# Patient Record
Sex: Female | Born: 1980 | Race: Black or African American | Hispanic: No | Marital: Married | Smoking: Never smoker
Health system: Southern US, Community
[De-identification: ages and names within clinical notes are randomized; demographics above are authoritative.]

## PROBLEM LIST (undated history)

## (undated) DIAGNOSIS — B019 Varicella without complication: Secondary | ICD-10-CM

## (undated) DIAGNOSIS — K219 Gastro-esophageal reflux disease without esophagitis: Secondary | ICD-10-CM

## (undated) DIAGNOSIS — Z5189 Encounter for other specified aftercare: Secondary | ICD-10-CM

## (undated) HISTORY — PX: CERVICAL CERCLAGE: SHX1329

## (undated) HISTORY — DX: Encounter for other specified aftercare: Z51.89

## (undated) HISTORY — DX: Varicella without complication: B01.9

---

## 2008-06-21 ENCOUNTER — Emergency Department (HOSPITAL_COMMUNITY): Admission: EM | Admit: 2008-06-21 | Discharge: 2008-06-21 | Payer: Self-pay | Admitting: Emergency Medicine

## 2008-06-23 ENCOUNTER — Emergency Department: Payer: Self-pay

## 2008-06-28 ENCOUNTER — Emergency Department: Payer: Self-pay

## 2008-09-25 ENCOUNTER — Emergency Department: Payer: Self-pay | Admitting: Internal Medicine

## 2009-10-21 ENCOUNTER — Ambulatory Visit: Payer: Self-pay | Admitting: Family Medicine

## 2009-10-21 ENCOUNTER — Ambulatory Visit: Payer: Self-pay | Admitting: Obstetrics and Gynecology

## 2009-11-30 ENCOUNTER — Emergency Department: Payer: Self-pay | Admitting: Emergency Medicine

## 2009-11-30 ENCOUNTER — Observation Stay: Payer: Self-pay | Admitting: Obstetrics and Gynecology

## 2010-01-14 ENCOUNTER — Ambulatory Visit: Payer: Self-pay | Admitting: Obstetrics and Gynecology

## 2010-01-15 ENCOUNTER — Ambulatory Visit: Payer: Self-pay | Admitting: Obstetrics and Gynecology

## 2010-01-20 ENCOUNTER — Inpatient Hospital Stay: Payer: Self-pay | Admitting: Obstetrics and Gynecology

## 2010-11-18 ENCOUNTER — Emergency Department: Payer: Self-pay | Admitting: Emergency Medicine

## 2011-04-26 ENCOUNTER — Emergency Department: Payer: Self-pay | Admitting: Emergency Medicine

## 2011-04-26 LAB — URINALYSIS, COMPLETE
Bilirubin,UR: NEGATIVE
Blood: NEGATIVE
Nitrite: NEGATIVE
Specific Gravity: 1.026 (ref 1.003–1.030)
Squamous Epithelial: 2
WBC UR: NONE SEEN /HPF (ref 0–5)

## 2015-04-20 ENCOUNTER — Ambulatory Visit (INDEPENDENT_AMBULATORY_CARE_PROVIDER_SITE_OTHER): Payer: 59 | Admitting: Family Medicine

## 2015-04-20 ENCOUNTER — Encounter: Payer: Self-pay | Admitting: Family Medicine

## 2015-04-20 VITALS — BP 126/82 | HR 63 | Temp 97.2°F | Ht 67.0 in | Wt 254.5 lb

## 2015-04-20 DIAGNOSIS — M545 Low back pain, unspecified: Secondary | ICD-10-CM

## 2015-04-20 DIAGNOSIS — M549 Dorsalgia, unspecified: Secondary | ICD-10-CM | POA: Insufficient documentation

## 2015-04-20 DIAGNOSIS — E669 Obesity, unspecified: Secondary | ICD-10-CM | POA: Diagnosis not present

## 2015-04-20 DIAGNOSIS — Z0000 Encounter for general adult medical examination without abnormal findings: Secondary | ICD-10-CM | POA: Insufficient documentation

## 2015-04-20 DIAGNOSIS — M898X1 Other specified disorders of bone, shoulder: Secondary | ICD-10-CM | POA: Insufficient documentation

## 2015-04-20 LAB — LIPID PANEL
CHOLESTEROL: 165 mg/dL (ref 0–200)
HDL: 49 mg/dL (ref >39.00)
LDL Cholesterol: 108 mg/dL — ABNORMAL HIGH (ref 0–99)
NonHDL: 115.7
Total CHOL/HDL Ratio: 3
Triglycerides: 38 mg/dL (ref 0.0–149.0)
VLDL: 7.6 mg/dL (ref 0.0–40.0)

## 2015-04-20 LAB — HEMOGLOBIN A1C: Hgb A1c MFr Bld: 5.5 % (ref 4.6–6.5)

## 2015-04-20 LAB — COMPREHENSIVE METABOLIC PANEL
ALBUMIN: 4.2 g/dL (ref 3.5–5.2)
ALK PHOS: 50 U/L (ref 39–117)
ALT: 13 U/L (ref 0–35)
AST: 13 U/L (ref 0–37)
BUN: 9 mg/dL (ref 6–23)
CO2: 27 mEq/L (ref 19–32)
Calcium: 9.2 mg/dL (ref 8.4–10.5)
Chloride: 104 mEq/L (ref 96–112)
Creatinine, Ser: 0.61 mg/dL (ref 0.40–1.20)
GFR: 144.89 mL/min (ref >60.00)
GLUCOSE: 80 mg/dL (ref 70–99)
POTASSIUM: 4 meq/L (ref 3.5–5.1)
Sodium: 139 mEq/L (ref 135–145)
TOTAL PROTEIN: 7.1 g/dL (ref 6.0–8.3)
Total Bilirubin: 0.6 mg/dL (ref 0.2–1.2)

## 2015-04-20 LAB — CBC
HEMATOCRIT: 41.1 % (ref 36.0–46.0)
HEMOGLOBIN: 13.7 g/dL (ref 12.0–15.0)
MCHC: 33.3 g/dL (ref 30.0–36.0)
MCV: 77.3 fl — ABNORMAL LOW (ref 78.0–100.0)
Platelets: 163 10*3/uL (ref 150.0–400.0)
RBC: 5.31 Mil/uL — ABNORMAL HIGH (ref 3.87–5.11)
RDW: 12.9 % (ref 11.5–15.5)
WBC: 4.1 10*3/uL (ref 4.0–10.5)

## 2015-04-20 LAB — TSH: TSH: 1.22 u[IU]/mL (ref 0.35–4.50)

## 2015-04-20 MED ORDER — MELOXICAM 15 MG PO TABS: 15.0000 mg | ORAL_TABLET | Freq: Every day | ORAL | Status: DC | PRN

## 2015-04-20 MED ORDER — CYCLOBENZAPRINE HCL 10 MG PO TABS: 10.0000 mg | ORAL_TABLET | Freq: Three times a day (TID) | ORAL | Status: DC | PRN

## 2015-04-20 NOTE — Assessment & Plan Note (Signed)
Pap smear up-to-date. Influenza flu vaccines up-to-date. Encouraged exercise and healthy eating today as patient has gained 100 pounds in the past 2 years. Screening labs today.

## 2015-04-20 NOTE — Progress Notes (Signed)
Subjective:  Patient ID: Rita [name], female    DOB: Aug 18, 1980  Age: 36 y.o. MRN: [id number]  CC: Establish care; Back pain.  HPI Rita [name] is a 35 y.o. female presents to the clinic today to establish care. She also complains of back pain.  Preventative Healthcare  Pap smear: Up-to-date. Was done 2 weeks ago at her OB/GYN's office.  Mammogram: Not indicated.  Immunizations  Tetanus - 2011.  Flu - up-to-date. 11/30/14.  Labs: Obtaining labs today.   Exercise: No regular exercise  Alcohol use: See below.   Smoking/tobacco use: No.  Wears seat belt: Yes.   Back pain  Patient reports a 2 month history of back pain.  She states the pain is located in her low back as well as just below the right scapula.  Pain is intermittently severe.  She's been taking BC powder and an OTC "Back Max" with some improvement.  No associated upper extremity numbness or tingling.  No exacerbating factors.  Of note, patient she really her pain to large breasts and was previously scheduled to see a Psychiatric nurse.  PMH, Surgical Hx, Family Hx, Social History reviewed and updated as below.  Past Medical History  Diagnosis Date  . Chicken pox    Past Surgical History  Procedure Laterality Date  . No past surgeries      Family History  Problem Relation Age of Onset  . Lung cancer Father   . Uterine cancer Paternal Aunt   . Stroke Maternal Grandmother   . Breast cancer Paternal Grandmother   . Breast cancer Paternal Aunt    Social History  Substance Use Topics  . Smoking status: Never Smoker   . Smokeless tobacco: Never Used  . Alcohol Use: 0.0 oz/week    0 Standard drinks or equivalent per week     Comment: Occasional (1 drink 1-2 times/month).   Review of Systems  Genitourinary:       Urinary incontinence.  Musculoskeletal: Positive for back pain.  All other systems reviewed and are negative.  Objective:   Today's Vitals: BP 126/82 mmHg  Pulse 63   Temp(Src) 97.2 F (36.2 C) (Oral)  Ht 5' 7"  (1.702 m)  Wt 254 lb 8 oz (115.44 kg)  BMI 39.85 kg/m2  SpO2 98%  LMP 03/23/2015  Physical Exam  Constitutional: She is oriented to person, place, and time. She appears well-developed and well-nourished. No distress.  Obese.   HENT:  Head: Normocephalic and atraumatic.  Mouth/Throat: Oropharynx is clear and moist. No oropharyngeal exudate.  Normal TM's bilaterally.   Eyes: Conjunctivae are normal. No scleral icterus.  Neck: Neck supple. No thyromegaly present.  Cardiovascular: Normal rate and regular rhythm.   No murmur heard. Pulmonary/Chest: Effort normal and breath sounds normal. She has no wheezes. She has no rales.  Abdominal: Soft. She exhibits no distension. There is no tenderness. There is no rebound and no guarding.  Musculoskeletal:  Back  R scapula - discrete area of tenderness to palpation just below the scapula.  Low back - nontender to palpation. Normal ROM.  Lymphadenopathy:    She has no cervical adenopathy.  Neurological: She is alert and oriented to person, place, and time.  Skin: Skin is warm and dry. No rash noted.  Psychiatric: She has a normal mood and affect.  Vitals reviewed.  Assessment & Plan:   Problem List Items Addressed This Visit    Preventative health care - Primary    Pap smear up-to-date. Influenza flu  vaccines up-to-date. Encouraged exercise and healthy eating today as patient has gained 100 pounds in the past 2 years. Screening labs today.      Pain of right scapula    Pain just below the scapula. Appears MSK in origin. Treating with Mobic and flexeril. Also sending to physical therapy.      Relevant Orders   Ambulatory referral to Physical Therapy   Obesity (BMI 35.0-39.9 without comorbidity) (HCC)   Relevant Medications   phentermine (ADIPEX-P) 37.5 MG tablet   Other Relevant Orders   Amb ref to Medical Nutrition Therapy-MNT   CBC (Completed)   Comp Met (CMET) (Completed)    Lipid Profile (Completed)   HgB A1c (Completed)   TSH (Completed)   Back pain    MSK in origin. Sending to PT. Treating with Mobic and Flexeril.       Relevant Medications   meloxicam (MOBIC) 15 MG tablet   cyclobenzaprine (FLEXERIL) 10 MG tablet      Outpatient Encounter Prescriptions as of 04/20/2015  Medication Sig  . levonorgestrel (MIRENA) 20 MCG/24HR IUD by Intrauterine route.  . phentermine (ADIPEX-P) 37.5 MG tablet Take 37.5 mg by mouth daily.  . cyclobenzaprine (FLEXERIL) 10 MG tablet Take 1 tablet (10 mg total) by mouth 3 (three) times daily as needed for muscle spasms.  . meloxicam (MOBIC) 15 MG tablet Take 1 tablet (15 mg total) by mouth daily as needed for pain.   No facility-administered encounter medications on file as of 04/20/2015.    Follow-up: 1-3 months.  [place]

## 2015-04-20 NOTE — Patient Instructions (Addendum)
We will be in contact regarding a referral to physical therapy and nutrition.  Use the meloxicam and Flexeril as needed for your pain/spasm.  Follow up in 1-3 months.  Take care  Dr. Lacinda Axon   Health Maintenance, Female Adopting a healthy lifestyle and getting preventive care can go a long way to promote health and wellness. Talk with your health care provider about what schedule of regular examinations is right for you. This is a good chance for you to check in with your provider about disease prevention and staying healthy. In between checkups, there are plenty of things you can do on your own. Experts have done a lot of research about which lifestyle changes and preventive measures are most likely to keep you healthy. Ask your health care provider for more information. WEIGHT AND DIET  Eat a healthy diet  Be sure to include plenty of vegetables, fruits, low-fat dairy products, and lean protein.  Do not eat a lot of foods high in solid fats, added sugars, or salt.  Get regular exercise. This is one of the most important things you can do for your health.  Most adults should exercise for at least 150 minutes each week. The exercise should increase your heart rate and make you sweat (moderate-intensity exercise).  Most adults should also do strengthening exercises at least twice a week. This is in addition to the moderate-intensity exercise.  Maintain a healthy weight  Body mass index (BMI) is a measurement that can be used to identify possible weight problems. It estimates body fat based on height and weight. Your health care provider can help determine your BMI and help you achieve or maintain a healthy weight.  For females 56 years of age and older:   A BMI below 18.5 is considered underweight.  A BMI of 18.5 to 24.9 is normal.  A BMI of 25 to 29.9 is considered overweight.  A BMI of 30 and above is considered obese.  Watch levels of cholesterol and blood lipids  You should  start having your blood tested for lipids and cholesterol at 34 years of age, then have this test every 5 years.  You may need to have your cholesterol levels checked more often if:  Your lipid or cholesterol levels are high.  You are older than 34 years of age.  You are at high risk for heart disease.  CANCER SCREENING   Lung Cancer  Lung cancer screening is recommended for adults 56-24 years old who are at high risk for lung cancer because of a history of smoking.  A yearly low-dose CT scan of the lungs is recommended for people who:  Currently smoke.  Have quit within the past 15 years.  Have at least a 30-pack-year history of smoking. A pack year is smoking an average of one pack of cigarettes a day for 1 year.  Yearly screening should continue until it has been 15 years since you quit.  Yearly screening should stop if you develop a health problem that would prevent you from having lung cancer treatment.  Breast Cancer  Practice breast self-awareness. This means understanding how your breasts normally appear and feel.  It also means doing regular breast self-exams. Let your health care provider know about any changes, no matter how small.  If you are in your 20s or 30s, you should have a clinical breast exam (CBE) by a health care provider every 1-3 years as part of a regular health exam.  If you are 40  or older, have a CBE every year. Also consider having a breast X-ray (mammogram) every year.  If you have a family history of breast cancer, talk to your health care provider about genetic screening.  If you are at high risk for breast cancer, talk to your health care provider about having an MRI and a mammogram every year.  Breast cancer gene (BRCA) assessment is recommended for women who have family members with BRCA-related cancers. BRCA-related cancers include:  Breast.  Ovarian.  Tubal.  Peritoneal cancers.  Results of the assessment will determine the  need for genetic counseling and BRCA1 and BRCA2 testing. Cervical Cancer Your health care provider may recommend that you be screened regularly for cancer of the pelvic organs (ovaries, uterus, and vagina). This screening involves a pelvic examination, including checking for microscopic changes to the surface of your cervix (Pap test). You may be encouraged to have this screening done every 3 years, beginning at age 46.  For women ages 59-65, health care providers may recommend pelvic exams and Pap testing every 3 years, or they may recommend the Pap and pelvic exam, combined with testing for human papilloma virus (HPV), every 5 years. Some types of HPV increase your risk of cervical cancer. Testing for HPV may also be done on women of any age with unclear Pap test results.  Other health care providers may not recommend any screening for nonpregnant women who are considered low risk for pelvic cancer and who do not have symptoms. Ask your health care provider if a screening pelvic exam is right for you.  If you have had past treatment for cervical cancer or a condition that could lead to cancer, you need Pap tests and screening for cancer for at least 20 years after your treatment. If Pap tests have been discontinued, your risk factors (such as having a new sexual partner) need to be reassessed to determine if screening should resume. Some women have medical problems that increase the chance of getting cervical cancer. In these cases, your health care provider may recommend more frequent screening and Pap tests. Colorectal Cancer  This type of cancer can be detected and often prevented.  Routine colorectal cancer screening usually begins at 34 years of age and continues through 34 years of age.  Your health care provider may recommend screening at an earlier age if you have risk factors for colon cancer.  Your health care provider may also recommend using home test kits to check for hidden blood in  the stool.  A small camera at the end of a tube can be used to examine your colon directly (sigmoidoscopy or colonoscopy). This is done to check for the earliest forms of colorectal cancer.  Routine screening usually begins at age 26.  Direct examination of the colon should be repeated every 5-10 years through 34 years of age. However, you may need to be screened more often if early forms of precancerous polyps or small growths are found. Skin Cancer  Check your skin from head to toe regularly.  Tell your health care provider about any new moles or changes in moles, especially if there is a change in a mole's shape or color.  Also tell your health care provider if you have a mole that is larger than the size of a pencil eraser.  Always use sunscreen. Apply sunscreen liberally and repeatedly throughout the day.  Protect yourself by wearing long sleeves, pants, a wide-brimmed hat, and sunglasses whenever you are outside. HEART DISEASE,  DIABETES, AND HIGH BLOOD PRESSURE   High blood pressure causes heart disease and increases the risk of stroke. High blood pressure is more likely to develop in:  People who have blood pressure in the high end of the normal range (130-139/85-89 mm Hg).  People who are overweight or obese.  People who are African American.  If you are 15-51 years of age, have your blood pressure checked every 3-5 years. If you are 62 years of age or older, have your blood pressure checked every year. You should have your blood pressure measured twice--once when you are at a hospital or clinic, and once when you are not at a hospital or clinic. Record the average of the two measurements. To check your blood pressure when you are not at a hospital or clinic, you can use:  An automated blood pressure machine at a pharmacy.  A home blood pressure monitor.  If you are between 16 years and 93 years old, ask your health care provider if you should take aspirin to prevent  strokes.  Have regular diabetes screenings. This involves taking a blood sample to check your fasting blood sugar level.  If you are at a normal weight and have a low risk for diabetes, have this test once every three years after 34 years of age.  If you are overweight and have a high risk for diabetes, consider being tested at a younger age or more often. PREVENTING INFECTION  Hepatitis B  If you have a higher risk for hepatitis B, you should be screened for this virus. You are considered at high risk for hepatitis B if:  You were born in a country where hepatitis B is common. Ask your health care provider which countries are considered high risk.  Your parents were born in a high-risk country, and you have not been immunized against hepatitis B (hepatitis B vaccine).  You have HIV or AIDS.  You use needles to inject street drugs.  You live with someone who has hepatitis B.  You have had sex with someone who has hepatitis B.  You get hemodialysis treatment.  You take certain medicines for conditions, including cancer, organ transplantation, and autoimmune conditions. Hepatitis C  Blood testing is recommended for:  Everyone born from [birth year] through 1964.  Anyone with known risk factors for hepatitis C. Sexually transmitted infections (STIs)  You should be screened for sexually transmitted infections (STIs) including gonorrhea and chlamydia if:  You are sexually active and are younger than 34 years of age.  You are older than 34 years of age and your health care provider tells you that you are at risk for this type of infection.  Your sexual activity has changed since you were last screened and you are at an increased risk for chlamydia or gonorrhea. Ask your health care provider if you are at risk.  If you do not have HIV, but are at risk, it may be recommended that you take a prescription medicine daily to prevent HIV infection. This is called pre-exposure prophylaxis  (PrEP). You are considered at risk if:  You are sexually active and do not regularly use condoms or know the HIV status of your partner(s).  You take drugs by injection.  You are sexually active with a partner who has HIV. Talk with your health care provider about whether you are at high risk of being infected with HIV. If you choose to begin PrEP, you should first be tested for HIV. You should then  be tested every 3 months for as long as you are taking PrEP.  PREGNANCY   If you are premenopausal and you may become pregnant, ask your health care provider about preconception counseling.  If you may become pregnant, take 400 to 800 micrograms (mcg) of folic acid every day.  If you want to prevent pregnancy, talk to your health care provider about birth control (contraception). OSTEOPOROSIS AND MENOPAUSE   Osteoporosis is a disease in which the bones lose minerals and strength with aging. This can result in serious bone fractures. Your risk for osteoporosis can be identified using a bone density scan.  If you are 65 years of age or older, or if you are at risk for osteoporosis and fractures, ask your health care provider if you should be screened.  Ask your health care provider whether you should take a calcium or vitamin D supplement to lower your risk for osteoporosis.  Menopause may have certain physical symptoms and risks.  Hormone replacement therapy may reduce some of these symptoms and risks. Talk to your health care provider about whether hormone replacement therapy is right for you.  HOME CARE INSTRUCTIONS   Schedule regular health, dental, and eye exams.  Stay current with your immunizations.   Do not use any tobacco products including cigarettes, chewing tobacco, or electronic cigarettes.  If you are pregnant, do not drink alcohol.  If you are breastfeeding, limit how much and how often you drink alcohol.  Limit alcohol intake to no more than 1 drink per day for  nonpregnant women. One drink equals 12 ounces of beer, 5 ounces of wine, or 1 ounces of hard liquor.  Do not use street drugs.  Do not share needles.  Ask your health care provider for help if you need support or information about quitting drugs.  Tell your health care provider if you often feel depressed.  Tell your health care provider if you have ever been abused or do not feel safe at home.   This information is not intended to replace advice given to you by your health care provider. Make sure you discuss any questions you have with your health care provider.   Document Released: 08/02/2010 Document Revised: 02/07/2014 Document Reviewed: 12/19/2012 Elsevier Interactive Patient Education [employer].

## 2015-04-20 NOTE — Progress Notes (Signed)
Pre visit review using our clinic review tool, if applicable. No additional management support is needed unless otherwise documented below in the visit note. 

## 2015-04-20 NOTE — Assessment & Plan Note (Signed)
Pain just below the scapula. Appears MSK in origin. Treating with Mobic and flexeril. Also sending to physical therapy.

## 2015-04-20 NOTE — Assessment & Plan Note (Signed)
MSK in origin. Sending to PT. Treating with Mobic and Flexeril.

## 2015-04-29 ENCOUNTER — Ambulatory Visit: Payer: 59 | Attending: Family Medicine

## 2015-04-29 DIAGNOSIS — M898X1 Other specified disorders of bone, shoulder: Secondary | ICD-10-CM | POA: Diagnosis not present

## 2015-04-29 DIAGNOSIS — M545 Low back pain: Secondary | ICD-10-CM | POA: Diagnosis present

## 2015-04-29 NOTE — Patient Instructions (Signed)
Pt was recommended to place a towel roll behind her low back when sitting at work. Pt verbalized understanding.

## 2015-04-29 NOTE — Therapy (Signed)
[place] [facility] [address], [place], [address] Phone: [phone]   Fax:  [phone]  Physical Therapy Evaluation  Patient Details  Name: Rita [name] MRN: [id number] Date of Birth: Jun 09, 1980 Referring Provider: Tommie Sams, DO  Encounter Date: 04/29/2015      PT End of Session - 04/29/15 0805    Visit Number 1   Number of Visits 9   Date for PT Re-Evaluation 05/28/15   PT Start Time 0805  pt filling out paperwork   PT Stop Time 0908   PT Time Calculation (min) 63 min   Activity Tolerance Patient tolerated treatment well   Behavior During Therapy [facility] for tasks assessed/performed      Past Medical History  Diagnosis Date  . Chicken pox     Past Surgical History  Procedure Laterality Date  . Cervical cerclage      for childbirth    There were no vitals filed for this visit.  Visit Diagnosis:  Pain of right scapula - Plan: PT plan of care cert/re-cert  Bilateral low back pain, with sciatica presence unspecified - Plan: PT plan of care cert/re-cert      Subjective Assessment - 04/29/15 0812    Subjective R shoulder blade: 8/10 at worst (3 weeks ago). 3/10 R shoulder blade currently (pt sitting) and at best.   Low back: 5/10 at worst (deep aching pain); 0/10 current (pt sitting on chair) and best.    Pertinent History R scapular and back pain. Pain worsened 3 weeks ago. Pt states that her pain increased over time. Had low back pain for a while. Low back pain increased which went up to her R side. Pain is predominantly inferior to her shoulder blade. Difficulty sleeping due to pain. Area is also sensitive to touch. 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 Pain worsens towards the end of the day. Uses a headset when talking on the phone at work. Pt is R hand dominant.   Patient Stated Goals Improve overall mobility.    Currently in  Pain? Yes   Pain Score --  please see subjective   Pain Location --  R scapula, bilateral low back   Pain Descriptors / Indicators Aching;Burning;Stabbing   Pain Type Acute pain   Pain Onset 1 to 4 weeks ago   Pain Frequency Constant   Aggravating Factors  sitting (increases stiffness from shoulder blade to R flank).    Pain Relieving Factors Standing up, backaid max OTC, muscle rub (like Bengay), sleeping on a firm mattress   Multiple Pain Sites Yes  R scapula and low back      Objectives:   There-ex  Directed patient with R shoulder adduction resisting red band 5x5 seconds   L shoulder adduction resisting red band 5x (increased R scapular symptoms) L lateral shift correction (decreased R scapular symptoms but increased L low back discomfort; low back discomfort decreased with rest)  Sitting with lumbar towel roll (decreased low back discomfort). Reviewed and given as part of her HEP. Pt verbalized understanding.   Improved exercise technique, movement at target joints, use of target muscles after mod verbal, visual, tactile cues.              [facility] PT Assessment - 04/29/15 0802    Assessment   Medical Diagnosis Pain in R scapula   Referring Provider Tommie Sams, DO  Onset Date/Surgical Date 04/01/15  3 weeks ago   Hand Dominance Right   Prior Therapy No known physical therapy for current condition   Precautions   Precaution Comments no known precautions   Restrictions   Other Position/Activity Restrictions no known restrictions   Balance Screen   Has the patient fallen in the past 6 months No   Has the patient had a decrease in activity level because of a fear of falling?  No   Is the patient reluctant to leave their home because of a fear of falling?  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    Observation/Other Assessments   Observations Repeated lumbar flexion test increased R scapular symptoms. Long sit test suggests possible anterior nutation of R innominate   Modified Oswertry 30%   Posture/Postural Control   Posture Comments L lateral shift, slight R mid back side bend, bilateral genu valgus, bilateral foot pronation   AROM   Right Shoulder Internal Rotation --  Functional IR: thumb to L3 with symptoms   Right Shoulder External Rotation --  Functional ER: index finger to T2 with symptoms   Left Shoulder Internal Rotation --  Functional IR: full   Left Shoulder External Rotation --  functional ER: full   Cervical Flexion Full   Cervical Extension WFL with R UT symptoms  movement preference to C5/C6   Cervical - Right Side Bend WFL   Cervical - Left Side Bend WFL with R UT symptoms   Cervical - Right Rotation WFL with R UT symptoms   Cervical - Left Rotation WFL   Lumbar Flexion WFL   Lumbar Extension limited with R trunk pain. Reproduced R shoulder blade symptoms the most  movement preferenc to around L3/L4   Lumbar - Right Side Bend limited with inferior R scapular pain   Lumbar - Left Side Bend limited with L low back discomfort. No R scapular symptoms.   Lumbar - Right Rotation limited with reproduction in inferior shoulder blade symptoms   Lumbar - Left Rotation WFL, no pain.    Strength   Right Hip Extension 4-/5   Left Hip Extension 4-/5   Palpation   Palpation comment Slight increase L rhomboid minor muscle tension. TTP R L5,4,3,2,1; TTP R T11,10,9 (most TTP around T9 with reproduction of symptoms), 8,7,6,5,4,3                          PT Education - 04/29/15 1323    Education provided Yes   Education Details ther-ex, HEP, plan of care   Person(s) Educated Patient   Methods Explanation;Demonstration;Tactile cues;Verbal cues   Comprehension Verbalized understanding;Returned  demonstration             PT Long Term Goals - 04/29/15 1307    PT LONG TERM GOAL #1   Title Patient will have a decrease in R scapular pain to 5/10 or less at worst to promote ability to tolerate sitting and perform work related tasks as well as to take care of her child.    Baseline 8/10 at worst   Time 4   Period Weeks   Status New   PT LONG TERM GOAL #2   Title Patient will have a decrease  in low back pain to 3/10 or less at worst to promote ability to tolerate sitting and perform work related tasks as well as to take care of her child.    Baseline 5/10 at worst   Time 4   Period Weeks   Status New   PT LONG TERM GOAL #3   Title Patient will improve her functional ER and IR AROM for R UE and without R scapular pain to promote ability to reach behind her head and back.    Time 4   Period Weeks   Status New   PT LONG TERM GOAL #4   Title Patient will improve her Modified Oswestry Low Back Pain Disability Questionnaire as a demonstration of improved function.    Baseline 30%   Time 4   Period Weeks   Status New               Plan - 04/29/15 1301    Clinical Impression Statement Patient is a 35 year old female who came to physical therapy secondary to R scapular and bilateral low back pain. She also presents with TTP to R low back and R thoracic spine, altered posture, limited R shoulder functional IR and ER, bilateral glute max weakness, and difficulty tolerating sitting (to perform work duties), as well as taking care of her child due to her symptoms. Patient will benefit from skilled physical therapy intervention to address the aforementioned deficits.    Pt will benefit from skilled therapeutic intervention in order to improve on the following deficits Pain;Decreased strength;Postural dysfunction;Decreased range of motion   Rehab Potential Good   Clinical Impairments Affecting Rehab Potential No known clinical impairments affecting rehab potential.    PT Frequency  2x / week   PT Duration 4 weeks   PT Treatment/Interventions Therapeutic activities;Therapeutic exercise;Manual techniques;Electrical Stimulation;Traction;Ultrasound;Neuromuscular re-education;Iontophoresis 4mg /ml Dexamethasone;Dry needling   PT Next Visit Plan posture, manual therapy, scapular, and hip strengthening, movement   Consulted and Agree with Plan of Care Patient         Problem List Patient Active Problem List   Diagnosis Date Noted  . Back pain 04/20/2015  . Pain of right scapula 04/20/2015  . Obesity (BMI 35.0-39.9 without comorbidity) (HCC) 04/20/2015  . Preventative health care 04/20/2015     Thank you for your referral.   Loralyn FreshwaterMiguel Harry Shuck PT, DPT   04/29/2015, 1:39 PM  [place] [facility] [address] [place], [place], [address] Phone: [phone]   Fax:  [phone]  Name: Rita [name] MRN: [id number] Date of Birth: 1980/05/09

## 2015-05-05 ENCOUNTER — Ambulatory Visit: Payer: 59 | Attending: Family Medicine

## 2015-05-05 DIAGNOSIS — M545 Low back pain: Secondary | ICD-10-CM | POA: Insufficient documentation

## 2015-05-05 DIAGNOSIS — M898X1 Other specified disorders of bone, shoulder: Secondary | ICD-10-CM | POA: Insufficient documentation

## 2015-05-05 DIAGNOSIS — M546 Pain in thoracic spine: Secondary | ICD-10-CM | POA: Diagnosis present

## 2015-05-05 NOTE — Therapy (Signed)
[facility] [address] [place], [place], [address] Phone: [phone]   Fax:  [phone]  Physical Therapy Treatment  Patient Details  Name: Rita [name] MRN: [id number] Date of Birth: October 14, 1980 Referring Provider: Tommie SamsJayce G Cook, DO  Encounter Date: 05/05/2015      PT End of Session - 05/05/15 0856    Visit Number 2   Number of Visits 9   Date for PT Re-Evaluation 05/28/15   PT Start Time 0856   PT Stop Time 0926   PT Time Calculation (min) 30 min   Activity Tolerance Patient tolerated treatment well   Behavior During Therapy [facility] for tasks assessed/performed      Past Medical History  Diagnosis Date  . Chicken pox     Past Surgical History  Procedure Laterality Date  . Cervical cerclage      for childbirth    There were no vitals filed for this visit.  Visit Diagnosis:  Pain of right scapula  Bilateral low back pain, with sciatica presence unspecified      Subjective Assessment - 05/05/15 0857    Subjective R shoulder blade feels a lot better. 2/10 currently. Low back is about 4-5/10.    Pertinent History R scapular and back pain. Pain worsened 3 weeks ago. Pt states that her pain increased over time. Had low back pain for a while. Low back pain increased which went up to her R side. Pain is predominantly inferior to her shoulder blade. Difficulty sleeping due to pain. Area is also sensitive to touch. 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 Standing up helps relieve pain. Pain worsens towards the end of the day. Uses a headset when talking on the phone at work. Pt is R hand dominant.   Patient Stated Goals Improve overall mobility.    Currently in Pain? Yes   Pain Score --  please see subjective   Pain Onset 1 to 4 weeks ago   Multiple Pain Sites Yes        Objectives  There-ex  Directed patient with sitting on chair with lumbar towel roll x 3 min    Standing L lateral shift correction 10x3 with 5 second holds (decreased back pain to 2-3/10. No R scapular pain afterwards)  Prone on elbows with bilateral hip IR with deep breaths x 50 seconds (back discomfort)  Prone glute max set 10x2 with 5 second holds each LE  L S/L R hip abduction 5x (increased R scapular tension) R S/L L hip abduction 5x3   Reviewed HEP. Pt demonstrated and verbalized understanding.     Improved exercise technique, movement at target joints, use of target muscles after mod verbal, visual, tactile cues.        Decreased low back and R scapular pain with L lateral shift correction. Continue with lateral shift correction and gentle lumbar extension related exercises. Decreased low back pain to 2/10 and R shoulder blade discomfort to 1-2/10 after session. Patient will benefit from continued skilled physical therapy services to decrease R scapular and low back pain and improve function.                    PT Education - 05/05/15 0900    Education provided Yes   Education Details ther-ex, HEP   Person(s) Educated Patient   Methods Explanation;Demonstration;Tactile cues;Verbal cues;Handout   Comprehension Verbalized understanding;Returned demonstration  PT Long Term Goals - 04/29/15 1307    PT LONG TERM GOAL #1   Title Patient will have a decrease in R scapular pain to 5/10 or less at worst to promote ability to tolerate sitting and perform work related tasks as well as to take care of her child.    Baseline 8/10 at worst   Time 4   Period Weeks   Status New   PT LONG TERM GOAL #2   Title Patient will have a decrease in low back pain to 3/10 or less at worst to promote ability to tolerate sitting and perform work related tasks as well as to take care of her child.    Baseline 5/10 at worst   Time 4   Period Weeks   Status New   PT LONG TERM GOAL #3   Title Patient will improve her functional ER and IR AROM for R UE and  without R scapular pain to promote ability to reach behind her head and back.    Time 4   Period Weeks   Status New   PT LONG TERM GOAL #4   Title Patient will improve her Modified Oswestry Low Back Pain Disability Questionnaire as a demonstration of improved function.    Baseline 30%   Time 4   Period Weeks   Status New               Plan - 05/05/15 0900    Clinical Impression Statement Decreased low back and R scapular pain with L lateral shift correction. Continue with lateral shift correction and gentle lumbar extension related exercises. Decreased low back pain to 2/10 and R shoulder blade discomfort to 1-2/10 after session. Patient will benefit from continued skilled physical therapy services to decrease R scapular and low back pain and improve function.    Pt will benefit from skilled therapeutic intervention in order to improve on the following deficits Pain;Decreased strength;Postural dysfunction;Decreased range of motion   Rehab Potential Good   Clinical Impairments Affecting Rehab Potential No known clinical impairments affecting rehab potential.    PT Frequency 2x / week   PT Duration 4 weeks   PT Treatment/Interventions Therapeutic activities;Therapeutic exercise;Manual techniques;Electrical Stimulation;Traction;Ultrasound;Neuromuscular re-education;Iontophoresis /ml Dexamethasone;Dry needling   PT Next Visit Plan posture, manual therapy, scapular, and hip strengthening, movement   Consulted and Agree with Plan of Care Patient        Problem List Patient Active Problem List   Diagnosis Date Noted  . Back pain 04/20/2015  . Pain of right scapula 04/20/2015  . Obesity (BMI 35.0-39.9 without comorbidity) (HCC) 04/20/2015  . Preventative health care 04/20/2015    Loralyn Freshwater PT, DPT   05/05/2015, 11:40 AM  [place] [facility] SPORTS MEDICINE [address] [place], [address] Phone: [phone]   Fax:   [phone]  Name: Rita [name] MRN: [id number] Date of Birth: [birth year]

## 2015-05-05 NOTE — Patient Instructions (Signed)
Stand with your L elbow bent to 90 degrees.  Rest your shoulder against the wall. Gently, bring your left hip towards the wall.  Hold for 5 seconds. Repeat 5 times.  Do 6 sets.     Sit on your chair with a towel roll behind your low back.

## 2015-05-06 ENCOUNTER — Ambulatory Visit: Payer: 59

## 2015-05-06 DIAGNOSIS — M898X1 Other specified disorders of bone, shoulder: Secondary | ICD-10-CM | POA: Diagnosis not present

## 2015-05-06 DIAGNOSIS — M545 Low back pain: Secondary | ICD-10-CM

## 2015-05-06 NOTE — Therapy (Signed)
[place] [facility] [address] [place], [address] Phone: [phone]   Fax:  [phone]  Physical Therapy Treatment  Patient Details  Name: Rita [name] MRN: [id number] Date of Birth: 18-Apr-1980 Referring Provider: Tommie Sams, DO  Encounter Date: 05/06/2015      PT End of Session - 05/06/15 0903    Visit Number 3   Number of Visits 9   Date for PT Re-Evaluation 05/28/15   PT Start Time 0903   PT Stop Time 0945   PT Time Calculation (min) 42 min   Activity Tolerance Patient tolerated treatment well   Behavior During Therapy [facility] for tasks assessed/performed      Past Medical History  Diagnosis Date  . Chicken pox     Past Surgical History  Procedure Laterality Date  . Cervical cerclage      for childbirth    There were no vitals filed for this visit.  Visit Diagnosis:  Pain of right scapula  Bilateral low back pain, with sciatica presence unspecified      Subjective Assessment - 05/06/15 0904    Subjective R scapula is doing fine. No pain there currently. Back feels like a deep soreness. 5/10 low back pain/deep soreness currently.    Pertinent History R scapular and back pain. Pain worsened 3 weeks ago. Pt states that her pain increased over time. Had low back pain for a while. Low back pain increased which went up to her R side. Pain is predominantly inferior to her shoulder blade. Difficulty sleeping due to pain. Area is also sensitive to touch. 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 Pain worsens towards the end of the day. Uses a headset when talking on the phone at work. Pt is R hand dominant.   Patient Stated Goals Improve overall mobility.    Currently in Pain? Yes   Pain Score 5   back symptoms   Pain Onset 1 to 4 weeks ago    Pt also adds that when she does the towel roll lumbar support at work, she feels like her back  is more supported.          Objectives  There-ex  Directed patient with sitting on chair with lumbar towel roll x 3 min with back extension (decreased back pain)   Standing R shoulder adduction resisting red band 10x5 seconds for 3 sets (decreased low back pain; no R scapular pain)  Standing bilateral shoulder extension at Omega machine plate 10 for 69G2 second holds for 2 sets  Standing L lateral shift correction 10x2 with 5 second holds (some increase in back pain)  Supine bridge 10x5 seconds for 3 sets (slight increase in back pain)  Prone glute max set 10x3 with 5 second holds each LE (decreased back pain)   Improved exercise technique, movement at target joints, use of target muscles after mod verbal, visual, tactile cues.      Patient tolerated session well without complain of scapular pain. Slight increase in low back symptoms with supine bridge and standing L lateral shift correction which decreased with standing R shoulder adduction resisting red band as well as prone glute max set. Overall, patient progressing well towards goals.                  PT Education - 05/06/15 0932    Education provided Yes   Education Details ther-ex,  HEP   Person(s) Educated Patient   Methods Explanation;Demonstration;Tactile cues;Verbal cues;Handout   Comprehension Verbalized understanding;Returned demonstration             PT Long Term Goals - 04/29/15 1307    PT LONG TERM GOAL #1   Title Patient will have a decrease in R scapular pain to 5/10 or less at worst to promote ability to tolerate sitting and perform work related tasks as well as to take care of her child.    Baseline 8/10 at worst   Time 4   Period Weeks   Status New   PT LONG TERM GOAL #2   Title Patient will have a decrease in low back pain to 3/10 or less at worst to promote ability to tolerate sitting and perform work related tasks as well as to take care of her child.    Baseline 5/10 at worst    Time 4   Period Weeks   Status New   PT LONG TERM GOAL #3   Title Patient will improve her functional ER and IR AROM for R UE and without R scapular pain to promote ability to reach behind her head and back.    Time 4   Period Weeks   Status New   PT LONG TERM GOAL #4   Title Patient will improve her Modified Oswestry Low Back Pain Disability Questionnaire as a demonstration of improved function.    Baseline 30%   Time 4   Period Weeks   Status New               Plan - 05/06/15 0910    Clinical Impression Statement Patient tolerated session well without complain of scapular pain. Slight increase in low back symptoms with supine bridge and standing L lateral shift correction which decreased with standing R shoulder adduction resisting red band as well as prone glute max set. Overall, patient progressing well towards goals. Decreased low back pain to 2/10 after session.     Pt will benefit from skilled therapeutic intervention in order to improve on the following deficits Pain;Decreased strength;Postural dysfunction;Decreased range of motion   Rehab Potential Good   Clinical Impairments Affecting Rehab Potential No known clinical impairments affecting rehab potential.    PT Frequency 2x / week   PT Duration 4 weeks   PT Treatment/Interventions Therapeutic activities;Therapeutic exercise;Manual techniques;Electrical Stimulation;Traction;Ultrasound;Neuromuscular re-education;Iontophoresis 4mg /ml Dexamethasone;Dry needling   PT Next Visit Plan posture, manual therapy, scapular, and hip strengthening, movement   Consulted and Agree with Plan of Care Patient        Problem List Patient Active Problem List   Diagnosis Date Noted  . Back pain 04/20/2015  . Pain of right scapula 04/20/2015  . Obesity (BMI 35.0-39.9 without comorbidity) (HCC) 04/20/2015  . Preventative health care 04/20/2015   Loralyn FreshwaterMiguel Laygo PT, DPT  05/06/2015, 12:21 PM  [place] [facility] [address] [place], [place], [address] Phone: [phone]   Fax:  [phone]  Name: Thompson Caulyone L Mies MRN: [id number] Date of Birth: 1980/11/29

## 2015-05-06 NOTE — Patient Instructions (Signed)
Strengthening: Resisted Adduction    Hold band in right hand, arm out. Pull arm toward right hip. Do not twist or rotate trunk. Hold for 5 seconds. Repeat ___10_ times per set. Do _3___ sets per session. Do _1___ sessions per day.  http://orth.exer.us/835   Copyright  VHI. All rights reserved.

## 2015-05-11 ENCOUNTER — Ambulatory Visit: Payer: 59

## 2015-05-11 DIAGNOSIS — M545 Low back pain: Secondary | ICD-10-CM

## 2015-05-11 DIAGNOSIS — M898X1 Other specified disorders of bone, shoulder: Secondary | ICD-10-CM | POA: Diagnosis not present

## 2015-05-11 NOTE — Therapy (Signed)
[facility]ICINE [address] [place], [place], [address] Phone: [phone]   Fax:  [phone]  Physical Therapy Treatment  Patient Details  Name: Rita [name] MRN: [id number] Date of Birth: [birth year] Referring Provider: Tommie SamsJayce G Cook, DO  Encounter Date: 05/11/2015      PT End of Session - 05/11/15 0935    Visit Number 4   Number of Visits 9   Date for PT Re-Evaluation 05/28/15   PT Start Time 0935   PT Stop Time 1019   PT Time Calculation (min) 44 min   Activity Tolerance Patient tolerated treatment well   Behavior [facility]/performed      Past Medical History  Diagnosis Date  . Chicken pox     Past Surgical History  Procedure Laterality Date  . Cervical cerclage      for childbirth    There were no vitals filed for this visit.      Subjective Assessment - 05/11/15 0936    Subjective The shoulder blade is doing really good. No pain at all. Back feels a little pain but not much. 2-3/10 currently.    Pertinent History R scapular and back pain. Pain worsened 3 weeks ago. Pt states that her pain increased over time. Had low back pain for a while. Low back pain increased which went up to her R side. Pain is predominantly inferior to her shoulder blade. Difficulty sleeping due to pain. Area is also sensitive to touch. 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 Standing up helps relieve pain. Pain worsens towards the end of the day. Uses a headset when talking on the phone at work. Pt is R hand dominant.   Patient Stated Goals Improve overall mobility.    Currently in Pain? Yes   Pain Score 3   2-3/10   Pain Onset 1 to 4 weeks ago   Multiple Pain Sites No          Objectives  There-ex  Directed patient with standing back extension (gentle) 10x5 second holds for 2 sets  Standing R shoulder adduction resisting red band 10x5 seconds for 2  sets    Standing bilateral shoulder extension at Omega machine plate 10 for 32G410x5 second holds for 3 sets  Prone glute max set 10x3 with 5 second holds each LE  Prone alterate shoulder flexion/hip extension 10x each side for 2 sets  Supine bridge 10x  Supine alternating leg extension with emphasis on lumbopelvic control 3x each LE (slight increase in low back discomfort)  L S/L R hip abduction 10x2  Standing bilateral scapular retraction resisting red band 10x5 seconds  Standing L lateral shift correction 4x5 seconds gently (slight low back discomfort)  Sitting on chair with lumbar towel roll x 2 min (decreased low back discomfort)    Improved exercise technique, movement at target joints, use of target muscles after mod verbal, visual, tactile cues.     Consistent carry over of progress with decreased R scapular and low back pain/discomfort from one session to the next. Pt tolerated session well without aggravation of symptoms.                    PT Education - 05/11/15 0943    Education provided Yes   Education Details ther-ex   Person(s) Educated Patient   Methods Explanation;Demonstration;Tactile cues;Verbal cues   Comprehension Verbalized understanding;Returned demonstration  PT Long Term Goals - 04/29/15 1307    PT LONG TERM GOAL #1   Title Patient will have a decrease in R scapular pain to 5/10 or less at worst to promote ability to tolerate sitting and perform work related tasks as well as to take care of her child.    Baseline 8/10 at worst   Time 4   Period Weeks   Status New   PT LONG TERM GOAL #2   Title Patient will have a decrease in low back pain to 3/10 or less at worst to promote ability to tolerate sitting and perform work related tasks as well as to take care of her child.    Baseline 5/10 at worst   Time 4   Period Weeks   Status New   PT LONG TERM GOAL #3   Title Patient will improve her functional ER and IR AROM for  R UE and without R scapular pain to promote ability to reach behind her head and back.    Time 4   Period Weeks   Status New   PT LONG TERM GOAL #4   Title Patient will improve her Modified Oswestry Low Back Pain Disability Questionnaire as a demonstration of improved function.    Baseline 30%   Time 4   Period Weeks   Status New               Plan - 05/11/15 0943    Clinical Impression Statement Consistent carry over of progress with decreased R scapular and low back pain/discomfort from one session to the next. Pt tolerated session well without aggravation of symptoms.    Rehab Potential Good   Clinical Impairments Affecting Rehab Potential No known clinical impairments affecting rehab potential.    PT Frequency 2x / week   PT Duration 4 weeks   PT Treatment/Interventions Therapeutic activities;Therapeutic exercise;Manual techniques;Electrical Stimulation;Traction;Ultrasound;Neuromuscular re-education;Iontophoresis /ml Dexamethasone;Dry needling   PT Next Visit Plan posture, manual therapy, scapular, and hip strengthening, movement   Consulted and Agree with Plan of Care Patient      Patient will benefit from skilled therapeutic intervention in order to improve the following deficits and impairments:  Pain, Decreased strength, Postural dysfunction, Decreased range of motion  Visit Diagnosis: Pain of right scapula  Bilateral low back pain, with sciatica presence unspecified     Problem List Patient Active Problem List   Diagnosis Date Noted  . Back pain 04/20/2015  . Pain of right scapula 04/20/2015  . Obesity (BMI 35.0-39.9 without comorbidity) (HCC) 04/20/2015  . Preventative health care 04/20/2015     Loralyn Freshwater PT, DPT  05/11/2015, 1:24 PM  [place] [facility] PHYSICAL AND SPORTS MEDICINE [address] [place], [address] Phone: [phone]   Fax:  [phone]  Name: Rita [name] MRN: [id number] Date of Birth:  [birth year]

## 2015-05-13 ENCOUNTER — Ambulatory Visit: Payer: 59

## 2015-05-13 DIAGNOSIS — M546 Pain in thoracic spine: Secondary | ICD-10-CM

## 2015-05-13 DIAGNOSIS — M545 Low back pain: Secondary | ICD-10-CM

## 2015-05-13 DIAGNOSIS — M898X1 Other specified disorders of bone, shoulder: Secondary | ICD-10-CM | POA: Diagnosis not present

## 2015-05-13 NOTE — Therapy (Signed)
[place] [facility] [address] [place], [place], [address] Phone: [phone]   Fax:  [phone]  Physical Therapy Treatment And Progress Report  Patient Details  Name: Rita [name] MRN: [id number] Date of Birth: [birth year] Referring Provider: Tommie SamsJayce G Cook, DO  Encounter Date: 05/13/2015      PT End of Session - 05/13/15 0808    Visit Number 5   Number of Visits 9   Date for PT Re-Evaluation 05/28/15   PT Start Time 0808   PT Stop Time 0836   PT Time Calculation (min) 28 min   Activity Tolerance Patient tolerated treatment well   Behavior During Therapy [facility] for tasks assessed/performed      Past Medical History  Diagnosis Date  . Chicken pox     Past Surgical History  Procedure Laterality Date  . Cervical cerclage      for childbirth    There were no vitals filed for this visit.      Subjective Assessment - 05/13/15 0809    Subjective Pt states being late because her significant other had car trouble. Everything feels a lot better today. 0/10 R scapular area, 1/10 low back pain currently.    Pertinent History R scapular and back pain. Pain worsened 3 weeks ago. Pt states that her pain increased over time. Had low back pain for a while. Low back pain increased which went up to her R side. Pain is predominantly inferior to her shoulder blade. Difficulty sleeping due to pain. Area is also sensitive to touch. 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 Standing up helps relieve pain. Pain worsens towards the end of the day. Uses a headset when talking on the phone at work. Pt is R hand dominant.   Patient Stated Goals Improve overall mobility.    Currently in Pain? Yes   Pain Score 1   low back   Pain Onset 1 to 4 weeks ago   Multiple Pain Sites No    R shoulder blade 2/10 at most for the past 5 days, 3/10 low back at most for the past 5 days        [facility] Assessment -  05/13/15 [id number]    Observation/Other Assessments   Modified Oswertry 18%   AROM   Right Shoulder External Rotation --  R index finger to L scapula (around T5), no pain; equal to L   Left Shoulder Internal Rotation --  R thumb to around T9; slight pain which eased with rest         Objectives  There-ex  Directed patient with standing back extension (gentle) 10x5 second holds  Standing bilateral shoulder extension at Omega machine plate 10 for 40N010x5 second holds for 3 sets  Standing R shoulder functional ER, and IR  Reviewed progress/current status with R shoulder AROM with pt  Prone alterate shoulder flexion/hip extension 10x each side for 2 sets  Prone glute max set 10x3 with 5 second holds each LE  L S/L R hip abduction 10x2   Improved exercise technique, movement at target joints, use of target muscles after mod verbal, visual, tactile cues.     Pt demonstrates very good carry over of decreased R thoracic/scapular pain and low back pain since initial evaluation as well as improved R shoulder functional AROM. Pt still demonstrates some scapular/thoracic and low back discomfort and would benefit from continued skilled physical therapy services to address  the aforementioned deficits.              PT Education - 05/13/15 [phone]    Education provided Yes   Education Details ther-ex   Person(s) Educated Patient   Methods Explanation;Demonstration;Tactile cues;Verbal cues   Comprehension Verbalized understanding;Returned demonstration             PT Long Term Goals - 05/13/15 0813    PT LONG TERM GOAL #1   Title Patient will have a decrease in R scapular pain to 5/10 or less at worst to promote ability to tolerate sitting and perform work related tasks as well as to take care of her child.    Time 4   Period Weeks   Status On-going   PT LONG TERM GOAL #2   Title Patient will have a decrease in low back pain to 3/10 or less at worst to promote ability to  tolerate sitting and perform work related tasks as well as to take care of her child.    Time 4   Period Weeks   Status On-going   PT LONG TERM GOAL #3   Title Patient will improve her functional ER and IR AROM for R UE and without R scapular pain to promote ability to reach behind her head and back.    Time 4   Period Weeks   Status Achieved   PT LONG TERM GOAL #4   Title Patient will improve her Modified Oswestry Low Back Pain Disability Questionnaire as a demonstration of improved function.    Baseline 30%; 18% current score   Time 4   Period Weeks   Status Achieved               Plan - 05/13/15 9604    Clinical Impression Statement Pt demonstrates very good carry over of decreased R thoracic/scapular pain and low back pain since initial evaluation as well as improved R shoulder functional AROM. Pt still demonstrates some scapular/thoracic and low back discomfort and would benefit from continued skilled physical therapy services to address the aforementioned deficits.    Rehab Potential Good   Clinical Impairments Affecting Rehab Potential No known clinical impairments affecting rehab potential.    PT Frequency 2x / week   PT Duration 4 weeks   PT Treatment/Interventions Therapeutic activities;Therapeutic exercise;Manual techniques;Electrical Stimulation;Traction;Ultrasound;Neuromuscular re-education;Iontophoresis /ml Dexamethasone;Dry needling   PT Next Visit Plan posture, manual therapy, scapular, and hip strengthening, movement   Consulted and Agree with Plan of Care Patient      Patient will benefit from skilled therapeutic intervention in order to improve the following deficits and impairments:  Pain, Decreased strength, Postural dysfunction, Decreased range of motion  Visit Diagnosis: Pain in thoracic spine - Plan: PT plan of care cert/re-cert  Bilateral low back pain, with sciatica presence unspecified - Plan: PT plan of care cert/re-cert     Problem  List Patient Active Problem List   Diagnosis Date Noted  . Back pain 04/20/2015  . Pain of right scapula 04/20/2015  . Obesity (BMI 35.0-39.9 without comorbidity) (HCC) 04/20/2015  . Preventative health care 04/20/2015   Thank you for your referral.  Loralyn Freshwater PT, DPT   05/13/2015, 8:59 PM  [place] [facility] PHYSICAL AND SPORTS MEDICINE [address], [place], [address] Phone: [phone]   Fax:  [phone]  Name: Rita [name] MRN: [id number] Date of Birth: 1980-08-21

## 2015-05-18 ENCOUNTER — Ambulatory Visit: Payer: 59

## 2015-05-18 VITALS — BP 119/64 | HR 70

## 2015-05-18 DIAGNOSIS — M545 Low back pain: Secondary | ICD-10-CM

## 2015-05-18 DIAGNOSIS — M898X1 Other specified disorders of bone, shoulder: Secondary | ICD-10-CM | POA: Diagnosis not present

## 2015-05-18 DIAGNOSIS — M546 Pain in thoracic spine: Secondary | ICD-10-CM

## 2015-05-18 NOTE — Therapy (Signed)
[facility]ORTS MEDICINE [address] [place], [address] Phone: [phone]   Fax:  [phone]  Physical Therapy Treatment  Patient Details  Name: Rita [name] MRN: [id number] Date of Birth: February 13, 1980 Referring Provider: Tommie Sams, DO  Encounter Date: 05/18/2015      PT End of Session - 05/18/15 0856    Visit Number 6   Number of Visits 9   Date for PT Re-Evaluation 05/28/15   PT Start Time 0856  pt arrived late   PT Stop Time 0929   PT Time Calculation (min) 33 min   Activity Tolerance Patient tolerated treatment well   Behavior During Therapy [facility] for tasks assessed/performed      Past Medical History  Diagnosis Date  . Chicken pox     Past Surgical History  Procedure Laterality Date  . Cervical cerclage      for childbirth    Filed Vitals:   05/18/15 0901  BP: 119/64  Pulse: 70        Subjective Assessment - 05/18/15 0900    Subjective Pt states feeling a little "woozy" this morning. Did no eat breakfast. Back feels good today, no problems. L shoulder feels good.    Pertinent History R scapular and back pain. Pain worsened 3 weeks ago. Pt states that her pain increased over time. Had low back pain for a while. Low back pain increased which went up to her R side. Pain is predominantly inferior to her shoulder blade. Difficulty sleeping due to pain. Area is also sensitive to touch. 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 Pain worsens towards the end of the day. Uses a headset when talking on the phone at work. Pt is R hand dominant.   Patient Stated Goals Improve overall mobility.    Currently in Pain? No/denies   Pain Score 0-No pain   Pain Onset 1 to 4 weeks ago   Multiple Pain Sites No                       Objectives  There-ex  Directed patient with standing back extension (gentle) 15x5 second  holds  Supine bridge 10x3  Standing bilateral shoulder extension at Omega machine plate 10 for 32G4 second holds for 3 sets  Side stepping holding onto 5 lbs dumbell each hand 32 ft x 2  Standing R shoulder adduction resisting green band (upgrade) 10x5 seconds   Improved exercise technique, movement at target joints, use of target muscles after min to mod verbal, visual, tactile cues.       Pt tolerated session well without aggravation of symptoms. Pt progressing very well towards goals.          PT Education - 05/18/15 0900    Education provided Yes   Education Details ther-ex   Person(s) Educated Patient   Methods Explanation;Demonstration;Tactile cues;Verbal cues   Comprehension Verbalized understanding;Returned demonstration             PT Long Term Goals - 05/13/15 0813    PT LONG TERM GOAL #1   Title Patient will have a decrease in R scapular pain to 5/10 or less at worst to promote ability to tolerate sitting and perform work related tasks as well as to take care of her child.    Time 4   Period Weeks   Status On-going   PT LONG  TERM GOAL #2   Title Patient will have a decrease in low back pain to 3/10 or less at worst to promote ability to tolerate sitting and perform work related tasks as well as to take care of her child.    Time 4   Period Weeks   Status On-going   PT LONG TERM GOAL #3   Title Patient will improve her functional ER and IR AROM for R UE and without R scapular pain to promote ability to reach behind her head and back.    Time 4   Period Weeks   Status Achieved   PT LONG TERM GOAL #4   Title Patient will improve her Modified Oswestry Low Back Pain Disability Questionnaire as a demonstration of improved function.    Baseline 30%; 18% current score   Time 4   Period Weeks   Status Achieved               Plan - 05/18/15 0900    Clinical Impression Statement Pt tolerated session well without aggravation of symptoms. Pt  progressing very well towards goals.    Rehab Potential Good   Clinical Impairments Affecting Rehab Potential No known clinical impairments affecting rehab potential.    PT Frequency 2x / week   PT Duration 4 weeks   PT Treatment/Interventions Therapeutic activities;Therapeutic exercise;Manual techniques;Electrical Stimulation;Traction;Ultrasound;Neuromuscular re-education;Iontophoresis 4mg /ml Dexamethasone;Dry needling   PT Next Visit Plan posture, manual therapy, scapular, and hip strengthening, movement   Consulted and Agree with Plan of Care Patient      Patient will benefit from skilled therapeutic intervention in order to improve the following deficits and impairments:  Pain, Decreased strength, Postural dysfunction, Decreased range of motion  Visit Diagnosis: Pain in thoracic spine  Bilateral low back pain, with sciatica presence unspecified     Problem List Patient Active Problem List   Diagnosis Date Noted  . Back pain 04/20/2015  . Pain of right scapula 04/20/2015  . Obesity (BMI 35.0-39.9 without comorbidity) (HCC) 04/20/2015  . Preventative health care 04/20/2015    Loralyn FreshwaterMiguel Macguire Holsinger PT, DPT     05/18/2015, 7:14 PM  [facility] [address] [place], [place], [address] Phone: [phone]   Fax:  [phone]  Name: Thompson Caulyone L Petrilla MRN: [id number] Date of Birth: Mar 02, 1980

## 2015-05-20 ENCOUNTER — Ambulatory Visit: Payer: 59

## 2015-05-20 DIAGNOSIS — M545 Low back pain: Secondary | ICD-10-CM

## 2015-05-20 DIAGNOSIS — M546 Pain in thoracic spine: Secondary | ICD-10-CM

## 2015-05-20 DIAGNOSIS — M898X1 Other specified disorders of bone, shoulder: Secondary | ICD-10-CM | POA: Diagnosis not present

## 2015-05-20 NOTE — Therapy (Signed)
[place] [facility] [address] [place], [place], [address] Phone: [phone]   Fax:  [phone]  Physical Therapy Treatment And Discharge Summary  Patient Details  Name: Rita [name] MRN: [id number] Date of Birth: 1981-01-03 Referring Provider: Tommie SamsJayce G Cook, DO  Encounter Date: 05/20/2015      PT End of Session - 05/20/15 0809    Visit Number 7   Number of Visits 9   Date for PT Re-Evaluation 05/28/15   PT Start Time 0809   PT Stop Time 0842   PT Time Calculation (min) 33 min   Activity Tolerance Patient tolerated treatment well   Behavior During Therapy [facility] for tasks assessed/performed      Past Medical History  Diagnosis Date  . Chicken pox     Past Surgical History  Procedure Laterality Date  . Cervical cerclage      for childbirth    There were no vitals filed for this visit.      Subjective Assessment - 05/20/15 0810    Subjective No pain at all. 0/10 R shoulder blade, 2/10 low back pain at most for the past 5 days. Pt also states like she feels she can continue with her home exercise program. Pt also states like she is progressing well towards her goals. Better able to play with son and does not need the pain patches/backaid anymore.    Pertinent History R scapular and back pain. Pain worsened 3 weeks ago. Pt states that her pain increased over time. Had low back pain for a while. Low back pain increased which went up to her R side. Pain is predominantly inferior to her shoulder blade. Difficulty sleeping due to pain. Area is also sensitive to touch. 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 Standing up helps relieve pain. Pain worsens towards the end of the day. Uses a headset when talking on the phone at work. Pt is R hand dominant.   Patient Stated Goals Improve overall mobility.    Currently in Pain? No/denies   Pain Score 0-No pain   Pain Onset 1 to 4 weeks ago    Multiple Pain Sites No                  Objectives  There-ex  Directed patient with standing back extension (gentle) 15x5 second holds  Supine bridge 10x3  Standing bilateral shoulder extension at Omega machine plate 10 for 69G210x5 second holds for 3 sets  Standing R shoulder adduction resisting blue band (upgrade; gave band for her HEP) 10x5 seconds  Side stepping holding onto 5 lbs dumbell each hand 32 ft x 2      Improved exercise technique, movement at target joints, use of target muscles after min to mod verbal, visual, tactile cues.       Pt has progressed very well with PT towards decreased thoracic/R scapular, and low back pain, improved R shoulder functional AROM, and improved function as shown in her Modified Oswestry Low Back Pain Disability Questionnaire since inititial evaluation. Patient has achieved all physical therapy goals. Skilled physical therapy services discharged with patient continuing progress with her home exercise program.               PT Education - 05/20/15 0816    Education provided Yes   Education Details ther-ex   Person(s) Educated Patient   Methods Explanation;Demonstration;Verbal cues   Comprehension Verbalized understanding;Returned demonstration  PT Long Term Goals - 05/20/15 1610    PT LONG TERM GOAL #1   Title Patient will have a decrease in R scapular pain to 5/10 or less at worst to promote ability to tolerate sitting and perform work related tasks as well as to take care of her child.    Time 4   Period Weeks   Status Achieved   PT LONG TERM GOAL #2   Title Patient will have a decrease in low back pain to 3/10 or less at worst to promote ability to tolerate sitting and perform work related tasks as well as to take care of her child.    Time 4   Period Weeks   Status Achieved   PT LONG TERM GOAL #3   Title Patient will improve her functional ER and IR AROM for R UE and without R scapular  pain to promote ability to reach behind her head and back.    Time 4   Period Weeks   Status Achieved   PT LONG TERM GOAL #4   Title Patient will improve her Modified Oswestry Low Back Pain Disability Questionnaire as a demonstration of improved function.    Baseline 30%; 18% current score   Time 4   Period Weeks   Status Achieved               Plan - 05/20/15 0818    Clinical Impression Statement Pt has progressed very well with PT towards decreased thoracic/R scapular, and low back pain, improved R shoulder functional AROM, and improved function as shown in her Modified Oswestry Low Back Pain Disability Questionnaire since inititial evaluation. Patient has achieved all physical therapy goals. Skilled physical therapy services discharged with patient continuing progress with her home exercise program.    Rehab Potential Good   Clinical Impairments Affecting Rehab Potential No known clinical impairments affecting rehab potential.    PT Frequency --   PT Duration --   PT Treatment/Interventions Therapeutic activities;Therapeutic exercise;Manual techniques;Electrical Stimulation;Traction;Ultrasound;Neuromuscular re-education;Iontophoresis /ml Dexamethasone;Dry needling   PT Next Visit Plan Continue progress with her HEP.    Consulted and Agree with Plan of Care Patient      Patient will benefit from skilled therapeutic intervention in order to improve the following deficits and impairments:  Pain, Decreased strength, Postural dysfunction, Decreased range of motion  Visit Diagnosis: Pain in thoracic spine  Bilateral low back pain, with sciatica presence unspecified     Problem List Patient Active Problem List   Diagnosis Date Noted  . Back pain 04/20/2015  . Pain of right scapula 04/20/2015  . Obesity (BMI 35.0-39.9 without comorbidity) (HCC) 04/20/2015  . Preventative health care 04/20/2015   Thank you for your referral.   Loralyn Freshwater PT, DPT   05/20/2015,  11:09 AM  [place] [facility] PHYSICAL AND SPORTS MEDICINE [address] [place], [address] Phone: [phone]   Fax:  [phone]  Name: KIMYA MCCAHILL MRN: [id number] Date of Birth: [birth year]

## 2015-05-29 ENCOUNTER — Ambulatory Visit: Payer: 59 | Admitting: Dietician

## 2015-06-16 ENCOUNTER — Ambulatory Visit: Payer: 59 | Admitting: Dietician

## 2015-06-22 ENCOUNTER — Ambulatory Visit: Payer: 59 | Admitting: Family Medicine

## 2015-06-22 DIAGNOSIS — Z0289 Encounter for other administrative examinations: Secondary | ICD-10-CM

## 2015-07-21 ENCOUNTER — Telehealth: Payer: Self-pay | Admitting: *Deleted

## 2015-07-21 ENCOUNTER — Telehealth: Payer: Self-pay | Admitting: Family Medicine

## 2015-07-21 NOTE — Telephone Encounter (Signed)
PLEASE NOTE: All timestamps contained within this report are represented as [place] Standard Time. CONFIDENTIALTY NOTICE: This fax transmission is intended only for the addressee. It contains information that is legally privileged, confidential or otherwise protected from use or disclosure. If you are not the intended recipient, you are strictly prohibited from reviewing, disclosing, copying using or disseminating any of this information or taking any action in reliance on or regarding this information. If you have received this fax in error, please notify us immediately by telephone so that we can arrange for its return to us. Phone: [phone], Toll-Free: [phone], Fax: [phone] Page: 1 of 1 Call Id: [id number] [facility] [place] TELEPHONE ADVICE RECORD [facility] Patient Name: Rita [name] DOB: [birth year] Initial Comment Caller states having left side chest pain, SOB; Nurse Assessment Nurse: Debera Latalston, RN, Tinnie GensJeffrey Date/Time (Eastern Time): 07/21/2015 11:59:58 AM Confirm and document reason for call. If symptomatic, describe symptoms. You must click the next button to save text entered. ---Caller states having left side chest pain, SOB. Symptoms started this morning. Has the patient traveled out of the country within the last 30 days? ---No Does the patient have any new or worsening symptoms? ---Yes Will a triage be completed? ---Yes Related visit to physician within the last 2 weeks? ---No Does the PT have any chronic conditions? (i.e. diabetes, asthma, etc.) ---No Is the patient pregnant or possibly pregnant? (Ask all females between the ages of 1112-55) ---No Is this a behavioral health or substance abuse call? ---No Guidelines Guideline Title Affirmed Question Affirmed Notes Chest Pain Difficulty breathing Final Disposition User Go to ED Now Debera Latalston, RN, Tinnie GensJeffrey Referrals [facility] Disagree/Comply:  Comply

## 2015-07-21 NOTE — Telephone Encounter (Signed)
FYI Patient was transferred to the Nurse Line,she had chest pain radiating down her whole left side. She stated even with sitting she has soreness.

## 2015-07-21 NOTE — Telephone Encounter (Signed)
Attempted to reach patient, to determine if she followed through with a ED visit.  Left a VM to return my call.

## 2015-07-22 NOTE — Telephone Encounter (Signed)
Patient stated that she did not go to ED because she felt better when she started to calm down. She states that she thinks it is a pulled muscle from moving stuff from her home. She says she is 80% better. I stated that if it gets worse to let our office know.

## 2016-12-23 ENCOUNTER — Emergency Department
Admission: EM | Admit: 2016-12-23 | Discharge: 2016-12-23 | Disposition: A | Discharge status: Home / Self Care | Payer: 59 | Attending: Emergency Medicine | Admitting: Emergency Medicine

## 2016-12-23 ENCOUNTER — Encounter: Payer: Self-pay | Admitting: Emergency Medicine

## 2016-12-23 ENCOUNTER — Other Ambulatory Visit: Payer: Self-pay

## 2016-12-23 DIAGNOSIS — A5909 Other urogenital trichomoniasis: Secondary | ICD-10-CM | POA: Insufficient documentation

## 2016-12-23 DIAGNOSIS — Z79899 Other long term (current) drug therapy: Secondary | ICD-10-CM | POA: Insufficient documentation

## 2016-12-23 LAB — WET PREP, GENITAL
Clue Cells Wet Prep HPF POC: NONE SEEN
Sperm: NONE SEEN
Yeast Wet Prep HPF POC: NONE SEEN

## 2016-12-23 LAB — URINALYSIS, COMPLETE (UACMP) WITH MICROSCOPIC
Bilirubin Urine: NEGATIVE
Glucose, UA: NEGATIVE mg/dL
KETONES UR: NEGATIVE mg/dL
NITRITE: POSITIVE — AB
PROTEIN: 30 mg/dL — AB
Specific Gravity, Urine: 1.029 (ref 1.005–1.030)
pH: 6 (ref 5.0–8.0)

## 2016-12-23 LAB — COMPREHENSIVE METABOLIC PANEL
ALBUMIN: 3.7 g/dL (ref 3.5–5.0)
ALT: 17 U/L (ref 14–54)
ANION GAP: 7 (ref 5–15)
AST: 18 U/L (ref 15–41)
Alkaline Phosphatase: 67 U/L (ref 38–126)
BILIRUBIN TOTAL: 0.5 mg/dL (ref 0.3–1.2)
BUN: 9 mg/dL (ref 6–20)
CHLORIDE: 108 mmol/L (ref 101–111)
CO2: 25 mmol/L (ref 22–32)
Calcium: 8.9 mg/dL (ref 8.9–10.3)
Creatinine, Ser: 0.51 mg/dL (ref 0.44–1.00)
GFR calc Af Amer: >60 mL/min (ref >60)
GFR calc non Af Amer: >60 mL/min (ref >60)
GLUCOSE: 93 mg/dL (ref 65–99)
POTASSIUM: 3.6 mmol/L (ref 3.5–5.1)
SODIUM: 140 mmol/L (ref 135–145)
TOTAL PROTEIN: 7.7 g/dL (ref 6.5–8.1)

## 2016-12-23 LAB — CBC
HEMATOCRIT: 40 % (ref 35.0–47.0)
HEMOGLOBIN: 13.4 g/dL (ref 12.0–16.0)
MCH: 26 pg (ref 26.0–34.0)
MCHC: 33.5 g/dL (ref 32.0–36.0)
MCV: 77.8 fL — AB (ref 80.0–100.0)
Platelets: 234 10*3/uL (ref 150–440)
RBC: 5.15 MIL/uL (ref 3.80–5.20)
RDW: 12.7 % (ref 11.5–14.5)
WBC: 4.9 10*3/uL (ref 3.6–11.0)

## 2016-12-23 LAB — LIPASE, BLOOD: LIPASE: 30 U/L (ref 11–51)

## 2016-12-23 LAB — CHLAMYDIA/NGC RT PCR (ARMC ONLY)
CHLAMYDIA TR: NOT DETECTED
N GONORRHOEAE: NOT DETECTED

## 2016-12-23 LAB — POCT PREGNANCY, URINE: PREG TEST UR: NEGATIVE

## 2016-12-23 MED ORDER — METRONIDAZOLE 500 MG PO TABS: 500.0000 mg | ORAL_TABLET | Freq: Two times a day (BID) | ORAL | 0 refills | Status: DC

## 2016-12-23 NOTE — ED Triage Notes (Signed)
Pt states that she has lower abdominal pain with palpable "lump" on the left side. Pt states that she is also having vaginal discharge. Pt states that the discharge is clear but has a foul odor to it. Pt in NAD at this time.

## 2016-12-23 NOTE — ED Notes (Signed)
AAOx3.  Skin warm and dry.  NAD 

## 2016-12-23 NOTE — ED Provider Notes (Signed)
[facility] Emergency Department Provider Note   ____________________________________________    I have reviewed the triage vital signs and the nursing notes.   HISTORY  Chief Complaint Abdominal Pain     HPI Rita [name] is a 36 y.o. female who presents with complaints of lower abdominal pain and vaginal discharge.  Patient reports foul-smelling vaginal discharge over the last week.  She reports it is not getting any better.  She has not taken anything for it.  She describes mild bilateral lower abdominal cramping sensation.  No history of STDs   Past Medical History:  Diagnosis Date  . Chicken pox     Patient Active Problem List   Diagnosis Date Noted  . Back pain 04/20/2015  . Pain of right scapula 04/20/2015  . Obesity (BMI 35.0-39.9 without comorbidity) 04/20/2015  . Preventative health care 04/20/2015    Past Surgical History:  Procedure Laterality Date  . CERVICAL CERCLAGE     for childbirth    Prior to Admission medications   Medication Sig Start Date End Date Taking? Authorizing Provider  cyclobenzaprine (FLEXERIL) 10 MG tablet Take 1 tablet (10 mg total) by mouth 3 (three) times daily as needed for muscle spasms. 04/20/15   Tommie Samsook, Jayce G, DO  levonorgestrel (MIRENA) 20 MCG/24HR IUD by Intrauterine route.    [provider]  meloxicam (MOBIC) 15 MG tablet Take 1 tablet (15 mg total) by mouth daily as needed for pain. 04/20/15   Tommie Samsook, Jayce G, DO  metroNIDAZOLE (FLAGYL) 500 MG tablet Take 1 tablet (500 mg total) by mouth 2 (two) times daily after a meal. 12/23/16   Jene EveryKinner, Dennise Raabe, MD  phentermine (ADIPEX-P) 37.5 MG tablet Take 37.5 mg by mouth daily. 04/15/15   [provider]     Allergies Metronidazole  Family History  Problem Relation Age of Onset  . Lung cancer Father   . Uterine cancer Paternal Aunt   . Stroke Maternal Grandmother   . Breast cancer Paternal Grandmother   . Breast cancer Paternal Aunt      Social History Social History   Tobacco Use  . Smoking status: Never Smoker  . Smokeless tobacco: Never Used  Substance Use Topics  . Alcohol use: No    Alcohol/week: 0.0 oz    Frequency: Never    Comment: Occasional (1 drink 1-2 times/month).  . Drug use: No    Review of Systems  Constitutional: No fever/chills Eyes: No visual changes.  ENT: No sore throat. Cardiovascular: Denies chest pain. Respiratory: Denies shortness of breath. Gastrointestinal: As above Genitourinary: Mild dysuria, vaginal discharge Musculoskeletal: Negative for back pain. Skin: Negative for rash. Neurological: Negative for headaches   ____________________________________________   PHYSICAL EXAM:  VITAL SIGNS: ED Triage Vitals  Enc Vitals Group     BP 12/23/16 1703 (!) 114/57     Pulse Rate 12/23/16 1703 61     Resp 12/23/16 1703 16     Temp 12/23/16 1703 97.8 F (36.6 C)     Temp Source 12/23/16 1703 Oral     SpO2 12/23/16 1703 100 %     Weight --      Height 12/23/16 1705 1.676 m (5\' 6" )     Head Circumference --      Peak Flow --      Pain Score 12/23/16 1742 3     Pain Loc --      Pain Edu? --      Excl. in GC? --  Constitutional: Alert and oriented. No acute distress. Pleasant and interactive Eyes: Conjunctivae are normal.   Nose: No congestion/rhinnorhea. Mouth/Throat: Mucous membranes are moist.   Neck:  Painless ROM Cardiovascular: Normal rate, regular rhythm.   Good peripheral circulation. Respiratory: Normal respiratory effort.  No retractions. Gastrointestinal: Soft and nontender. No distention.  No CVA tenderness. Genitourinary: Clear discharge, no significant cervicitis Musculoskeletal:  Warm and well perfused Neurologic:  Normal speech and language. No gross focal neurologic deficits are appreciated.  Skin:  Skin is warm, dry and intact. No rash noted. Psychiatric: Mood and affect are normal. Speech and behavior are  normal.  ____________________________________________   LABS (all labs ordered are listed, but only abnormal results are displayed)  Labs Reviewed  WET PREP, GENITAL - Abnormal; Notable for the following components:      Result Value   Trich, Wet Prep PRESENT (*)    WBC, Wet Prep HPF POC MANY (*)    All other components within normal limits  CBC - Abnormal; Notable for the following components:   MCV 77.8 (*)    All other components within normal limits  URINALYSIS, COMPLETE (UACMP) WITH MICROSCOPIC - Abnormal; Notable for the following components:   Color, Urine YELLOW (*)    APPearance HAZY (*)    Hgb urine dipstick SMALL (*)    Protein, ur 30 (*)    Nitrite POSITIVE (*)    Leukocytes, UA MODERATE (*)    Bacteria, UA MANY (*)    Squamous Epithelial / LPF 0-5 (*)    All other components within normal limits  CHLAMYDIA/NGC RT PCR ([facility] ONLY)  LIPASE, BLOOD  COMPREHENSIVE METABOLIC PANEL  POC URINE PREG, ED  POCT PREGNANCY, URINE   ____________________________________________  EKG  None ____________________________________________  RADIOLOGY  None ____________________________________________   PROCEDURES  Procedure(s) performed: No  Procedures   Critical Care performed: No ____________________________________________   INITIAL IMPRESSION / ASSESSMENT AND PLAN / ED COURSE  Pertinent labs & imaging results that were available during my care of the patient were reviewed by me and considered in my medical decision making (see chart for details).  Wet prep positive for Trichomonas .  Patient with reported Flagyl allergy on chart, patient denies this, never verified allergy, given no other treatment options she wants to take the Flagyl despite risks of possible or potential allergic reaction  A prescription for UTI called into [facility] pharmacy after discussing with patient    ____________________________________________   FINAL CLINICAL  IMPRESSION(S) / ED DIAGNOSES  Final diagnoses:  Trichomonal cervicitis        Note:  This document was prepared using Dragon voice recognition software and may include unintentional dictation errors.    Jene EveryKinner, Arpita Fentress, MD 12/23/16 [phone]

## 2017-06-22 ENCOUNTER — Emergency Department: Payer: 59

## 2017-06-22 ENCOUNTER — Emergency Department
Admission: EM | Admit: 2017-06-22 | Discharge: 2017-06-22 | Disposition: A | Discharge status: Home / Self Care | Payer: 59 | Attending: Emergency Medicine | Admitting: Emergency Medicine

## 2017-06-22 ENCOUNTER — Encounter: Payer: Self-pay | Admitting: Emergency Medicine

## 2017-06-22 ENCOUNTER — Other Ambulatory Visit: Payer: Self-pay

## 2017-06-22 DIAGNOSIS — N3000 Acute cystitis without hematuria: Secondary | ICD-10-CM | POA: Diagnosis not present

## 2017-06-22 DIAGNOSIS — R51 Headache: Secondary | ICD-10-CM | POA: Insufficient documentation

## 2017-06-22 DIAGNOSIS — R519 Headache, unspecified: Secondary | ICD-10-CM

## 2017-06-22 DIAGNOSIS — Z79899 Other long term (current) drug therapy: Secondary | ICD-10-CM | POA: Diagnosis not present

## 2017-06-22 LAB — COMPREHENSIVE METABOLIC PANEL
ALK PHOS: 59 U/L (ref 38–126)
ALT: 16 U/L (ref 14–54)
AST: 19 U/L (ref 15–41)
Albumin: 3.8 g/dL (ref 3.5–5.0)
Anion gap: 5 (ref 5–15)
BILIRUBIN TOTAL: 0.5 mg/dL (ref 0.3–1.2)
BUN: 8 mg/dL (ref 6–20)
CALCIUM: 8.3 mg/dL — AB (ref 8.9–10.3)
CHLORIDE: 108 mmol/L (ref 101–111)
CO2: 26 mmol/L (ref 22–32)
CREATININE: 0.5 mg/dL (ref 0.44–1.00)
GFR calc Af Amer: >60 mL/min (ref >60)
Glucose, Bld: 107 mg/dL — ABNORMAL HIGH (ref 65–99)
Potassium: 3.6 mmol/L (ref 3.5–5.1)
Sodium: 139 mmol/L (ref 135–145)
Total Protein: 7.2 g/dL (ref 6.5–8.1)

## 2017-06-22 LAB — CBC
HEMATOCRIT: 43.2 % (ref 35.0–47.0)
Hemoglobin: 14.1 g/dL (ref 12.0–16.0)
MCH: 26.1 pg (ref 26.0–34.0)
MCHC: 32.8 g/dL (ref 32.0–36.0)
MCV: 79.6 fL — AB (ref 80.0–100.0)
Platelets: 241 10*3/uL (ref 150–440)
RBC: 5.42 MIL/uL — ABNORMAL HIGH (ref 3.80–5.20)
RDW: 13.1 % (ref 11.5–14.5)
WBC: 4.7 10*3/uL (ref 3.6–11.0)

## 2017-06-22 LAB — URINALYSIS, COMPLETE (UACMP) WITH MICROSCOPIC
Bilirubin Urine: NEGATIVE
Glucose, UA: NEGATIVE mg/dL
Hgb urine dipstick: NEGATIVE
Ketones, ur: NEGATIVE mg/dL
Nitrite: POSITIVE — AB
PROTEIN: NEGATIVE mg/dL
SPECIFIC GRAVITY, URINE: 1.019 (ref 1.005–1.030)
pH: 7 (ref 5.0–8.0)

## 2017-06-22 LAB — POCT PREGNANCY, URINE: PREG TEST UR: NEGATIVE

## 2017-06-22 MED ORDER — CEPHALEXIN 500 MG PO CAPS
500.0000 mg | ORAL_CAPSULE | Freq: Once | ORAL | Status: AC
  Administered 2017-06-22: 500 mg via ORAL
  Filled 2017-06-22: qty 1

## 2017-06-22 MED ORDER — KETOROLAC TROMETHAMINE 30 MG/ML IJ SOLN
30.0000 mg | Freq: Once | INTRAMUSCULAR | Status: AC
  Administered 2017-06-22: 30 mg via INTRAVENOUS
  Filled 2017-06-22: qty 1

## 2017-06-22 MED ORDER — NITROFURANTOIN MONOHYD MACRO 100 MG PO CAPS: 100.0000 mg | ORAL_CAPSULE | Freq: Two times a day (BID) | ORAL | 0 refills | Status: AC

## 2017-06-22 MED ORDER — IOPAMIDOL (ISOVUE-370) INJECTION 76%
75.0000 mL | Freq: Once | INTRAVENOUS | Status: AC | PRN
  Administered 2017-06-22: 75 mL via INTRAVENOUS

## 2017-06-22 NOTE — ED Provider Notes (Signed)
[facility] Emergency Department Provider Note    First MD Initiated Contact with Patient 06/22/17 [phone]     (approximate)  I have reviewed the triage vital signs and the nursing notes.   HISTORY  Chief Complaint Headache    HPI Rita [name] is a 37 y.o. female presents to the emergency department with intermittent "throbbing occipital headache times years".  Patient states headache is provoked with sneezing bending over coughing.  Patient denies any weakness no numbness gait instability or visual changes.  Patient denies any familial history of aneurysms.  Patient denies any family history of migraines.  Patient does admit to photophobia.  Patient states current pain score 7 out of 10.   Past Medical History:  Diagnosis Date  . Chicken pox     Patient Active Problem List   Diagnosis Date Noted  . Back pain 04/20/2015  . Pain of right scapula 04/20/2015  . Obesity (BMI 35.0-39.9 without comorbidity) 04/20/2015  . Preventative health care 04/20/2015    Past Surgical History:  Procedure Laterality Date  . CERVICAL CERCLAGE     for childbirth    Prior to Admission medications   Medication Sig Start Date End Date Taking? Authorizing Provider  levonorgestrel (MIRENA) 20 MCG/24HR IUD 1 each by Intrauterine route once.    Yes [provider]  cyclobenzaprine (FLEXERIL) 10 MG tablet Take 1 tablet (10 mg total) by mouth 3 (three) times daily as needed for muscle spasms. Patient not taking: Reported on 06/22/2017 04/20/15   Tommie Sams, DO  meloxicam (MOBIC) 15 MG tablet Take 1 tablet (15 mg total) by mouth daily as needed for pain. Patient not taking: Reported on 06/22/2017 04/20/15   Tommie Sams, DO  metroNIDAZOLE (FLAGYL) 500 MG tablet Take 1 tablet (500 mg total) by mouth 2 (two) times daily after a meal. Patient not taking: Reported on 06/22/2017 12/23/16   Jene Every, MD  nitrofurantoin, macrocrystal-monohydrate, (MACROBID) 100 MG  capsule Take 1 capsule (100 mg total) by mouth 2 (two) times daily for 5 days. 06/22/17 06/27/17  Nita Sickle, MD    Allergies Metronidazole  Family History  Problem Relation Age of Onset  . Lung cancer Father   . Uterine cancer Paternal Aunt   . Stroke Maternal Grandmother   . Breast cancer Paternal Grandmother   . Breast cancer Paternal Aunt     Social History Social History   Tobacco Use  . Smoking status: Never Smoker  . Smokeless tobacco: Never Used  Substance Use Topics  . Alcohol use: No    Alcohol/week: 0.0 oz    Frequency: Never    Comment: Occasional (1 drink 1-2 times/month).  . Drug use: No    Review of Systems Constitutional: No fever/chills Eyes: No visual changes. ENT: No sore throat. Cardiovascular: Denies chest pain. Respiratory: Denies shortness of breath. Gastrointestinal: No abdominal pain.  No nausea, no vomiting.  No diarrhea.  No constipation. Genitourinary: Negative for dysuria. Musculoskeletal: Negative for neck pain.  Negative for back pain. Integumentary: Negative for rash. Neurological: Positive for headaches, negative for focal weakness or numbness.   ____________________________________________   PHYSICAL EXAM:  VITAL SIGNS: ED Triage Vitals  Enc Vitals Group     BP 06/22/17 0316 (!) 135/50     Pulse Rate 06/22/17 0316 76     Resp 06/22/17 0316 18     Temp 06/22/17 0316 97.7 F (36.5 C)     Temp Source 06/22/17 0316 Oral  SpO2 06/22/17 0316 100 %     Weight 06/22/17 0313 117.9 kg (260 lb)     Height 06/22/17 0313 1.676 m ( )     Head Circumference --      Peak Flow --      Pain Score 06/22/17 0312 8     Pain Loc --      Pain Edu? --      Excl. in GC? --     Constitutional: Alert and oriented. Well appearing and in no acute distress. Eyes: Conjunctivae are normal. PERRL. EOMI. Head: Atraumatic. Mouth/Throat: Mucous membranes are moist.  Oropharynx non-erythematous. Neck: No stridor.  No meningeal signs.     Cardiovascular: Normal rate, regular rhythm. Good peripheral circulation. Grossly normal heart sounds. Respiratory: Normal respiratory effort.  No retractions. Lungs CTAB. Gastrointestinal: Soft and nontender. No distention.  Musculoskeletal: No lower extremity tenderness nor edema. No gross deformities of extremities. Neurologic:  Normal speech and language. No gross focal neurologic deficits are appreciated.  Skin:  Skin is warm, dry and intact. No rash noted. Psychiatric: Mood and affect are normal. Speech and behavior are normal.  ____________________________________________   LABS (all labs ordered are listed, but only abnormal results are displayed)  Labs Reviewed  CBC - Abnormal; Notable for the following components:      Result Value   RBC 5.42 (*)    MCV 79.6 (*)    All other components within normal limits  COMPREHENSIVE METABOLIC PANEL - Abnormal; Notable for the following components:   Glucose, Bld 107 (*)    Calcium 8.3 (*)    All other components within normal limits  URINALYSIS, COMPLETE (UACMP) WITH MICROSCOPIC - Abnormal; Notable for the following components:   Color, Urine YELLOW (*)    APPearance HAZY (*)    Nitrite POSITIVE (*)    Leukocytes, UA MODERATE (*)    Bacteria, UA MANY (*)    All other components within normal limits  POCT PREGNANCY, URINE   ____________________________________  RADIOLOGY I, Linneus N Herberth Deharo, personally viewed and evaluated these images (plain radiographs) as part of my medical decision making, as well as reviewing the written report by the radiologist.    Official radiology report(s): Ct Angio Head W Or Wo Contrast  Result Date: 06/22/2017 CLINICAL DATA:  Headache for 1 year, worse with movement. EXAM: CT ANGIOGRAPHY HEAD TECHNIQUE: Multidetector CT imaging of the head was performed using the standard protocol during bolus administration of intravenous contrast. Multiplanar CT image reconstructions and MIPs were obtained to  evaluate the vascular anatomy. CONTRAST:  75mL ISOVUE-370 IOPAMIDOL (ISOVUE-370) INJECTION 76% COMPARISON:  None. FINDINGS: CT HEAD Brain: There is no evidence of acute infarct, intracranial hemorrhage, mass, midline shift, or extra-axial fluid collection. The ventricles and sulci are normal. The cerebellar tonsils extend at most minimally below the foramen magnum, within normal limits. Vascular: No hyperdense vessel. Skull: No fracture or focal osseous lesion. Sinuses: Visualized paranasal sinuses and mastoid air cells are clear. Orbits: Unremarkable. CTA HEAD Anterior circulation: The internal carotid arteries are widely patent from skull base to carotid termini. The ACAs and MCAs are patent without evidence of proximal branch occlusion or significant stenosis. No aneurysm or vascular malformation is identified. Posterior circulation: The visualized distal vertebral arteries are patent to the basilar. Patent PICA and SCA origins are identified bilaterally. The basilar artery is patent and mildly small in caliber diffusely on a congenital basis without significant focal stenosis. There is a patent right posterior communicating artery with hypoplastic right  P1 segment. Both PCAs are patent without evidence of significant stenosis. No aneurysm or vascular malformation is identified. Venous sinuses: As permitted by contrast timing, patent. Anatomic variants: None of significance. Delayed phase: No abnormal enhancement. IMPRESSION: Negative head CTA. Electronically Signed   By: Sebastian Ache M.D.   On: 06/22/2017 07:26    ____________________  Procedures   ____________________________________________   INITIAL IMPRESSION / ASSESSMENT AND PLAN / ED COURSE  As part of my medical decision making, I reviewed the following data within the electronic MEDICAL RECORD NUMBER 37 year old female presented with above-stated history and physical exam secondary to headache.  Given characteristics of the patient's headache  concern for possible migraine, posttraumatic headache, cerebral aneurysm versus mass lesion and as such CT angiogram of the head was performed.  Angiogram is pending at this time if angiogram was negative patient cleared for discharge home.  Patient given IV Toradol in the emergency department.  Incidental noted patient has a urinary tract infection for which she will be prescribed Keflex.  Patient's care transferred to Dr. Don Perking ______________________________  FINAL CLINICAL IMPRESSION(S) / ED DIAGNOSES  Final diagnoses:  Acute nonintractable headache, unspecified headache type  Acute cystitis without hematuria     MEDICATIONS GIVEN DURING THIS VISIT:  Medications  ketorolac (TORADOL) 30 MG/ML injection 30 mg (30 mg Intravenous Given 06/22/17 0608)  iopamidol (ISOVUE-370) 76 % injection 75 mL (75 mLs Intravenous Contrast Given 06/22/17 0653)  cephALEXin (KEFLEX) capsule 500 mg (500 mg Oral Given 06/22/17 0716)     ED Discharge Orders        Ordered    nitrofurantoin, macrocrystal-monohydrate, (MACROBID) 100 MG capsule  2 times daily     06/22/17 0850       Note:  This document was prepared using Dragon voice recognition software and may include unintentional dictation errors.    Darci Current, MD 06/23/17 [phone]

## 2017-06-22 NOTE — ED Triage Notes (Signed)
Patient ambulatory to triage with steady gait, without difficulty or distress noted; pt reports x yr having "throbbing" to head that increases with movement

## 2017-06-22 NOTE — ED Provider Notes (Signed)
-----------------------------------------   8:48 AM on 06/22/2017 -----------------------------------------   Blood pressure (!) 135/50, pulse 76, temperature 97.7 F (36.5 C), temperature source Oral, resp. rate 18, height  (1.676 m), weight 117.9 kg (260 lb), SpO2 100 %.  Assuming care from Dr. Manson Passey of [facility] [place] is a 37 y.o. female with a chief complaint of Headache .    Please refer to H&P by previous MD for further details.  The current plan of care is to f/u CTA and reassess.   I have personally reviewed the images performed during this visit and I agree with the Radiologist's read.   Interpretation by Radiologist:  Ct Angio Head W Or Wo Contrast  Result Date: 06/22/2017 CLINICAL DATA:  Headache for 1 year, worse with movement. EXAM: CT ANGIOGRAPHY HEAD TECHNIQUE: Multidetector CT imaging of the head was performed using the standard protocol during bolus administration of intravenous contrast. Multiplanar CT image reconstructions and MIPs were obtained to evaluate the vascular anatomy. CONTRAST:  75mL ISOVUE-370 IOPAMIDOL (ISOVUE-370) INJECTION 76% COMPARISON:  None. FINDINGS: CT HEAD Brain: There is no evidence of acute infarct, intracranial hemorrhage, mass, midline shift, or extra-axial fluid collection. The ventricles and sulci are normal. The cerebellar tonsils extend at most minimally below the foramen magnum, within normal limits. Vascular: No hyperdense vessel. Skull: No fracture or focal osseous lesion. Sinuses: Visualized paranasal sinuses and mastoid air cells are clear. Orbits: Unremarkable. CTA HEAD Anterior circulation: The internal carotid arteries are widely patent from skull base to carotid termini. The ACAs and MCAs are patent without evidence of proximal branch occlusion or significant stenosis. No aneurysm or vascular malformation is identified. Posterior circulation: The visualized distal vertebral arteries are patent to the basilar. Patent PICA and SCA origins  are identified bilaterally. The basilar artery is patent and mildly small in caliber diffusely on a congenital basis without significant focal stenosis. There is a patent right posterior communicating artery with hypoplastic right P1 segment. Both PCAs are patent without evidence of significant stenosis. No aneurysm or vascular malformation is identified. Venous sinuses: As permitted by contrast timing, patent. Anatomic variants: None of significance. Delayed phase: No abnormal enhancement. IMPRESSION: Negative head CTA. Electronically Signed   By: Sebastian Ache M.D.   On: 06/22/2017 07:26     CT negative. Patient remains extremely well appearing, neuro intact, no visual complaints. Labs WNL. UA positive for UTI, will treat with macrobid at home. Will refer patient to Neurology for further evaluation of HAs. Discussed return precautions with patient.        Don Perking, [place], MD 06/22/17 [phone]

## 2017-06-22 NOTE — Discharge Instructions (Addendum)
You have been seen in the Emergency Department (ED) for a headache. Your evaluation today was overall reassuring. Headaches have many possible causes. Most headaches aren't a sign of a more serious problem, and they will get better on their own.   Follow-up with your doctor in 12-24 hours if you are still having a headache. Otherwise follow up with your doctor in 3-5 days.  For pain take ibuprofen or tylenol  When should you call for help?  Call 911 or return to the ED anytime you think you may need emergency care. For example, call if:  You have signs of a stroke. These may include:  Sudden numbness, paralysis, or weakness in your face, arm, or leg, especially on only one side of your body.  Sudden vision changes.  Sudden trouble speaking.  Sudden confusion or trouble understanding simple statements.  Sudden problems with walking or balance.  A sudden, severe headache that is different from past headaches. You have new or worsening headache Nausea and vomiting associated with your headache Fever, neck stiffness associated with your headache  Call your doctor now or seek immediate medical care if:  You have a new or worse headache.  Your headache gets much worse.  How can you care for yourself at home?  Do not drive if you have taken a prescription pain medicine.  Rest in a quiet, dark room until your headache is gone. Close your eyes and try to relax or go to sleep. Don't watch TV or read.  Put a cold, moist cloth or cold pack on the painful area for 10 to 20 minutes at a time. Put a thin cloth between the cold pack and your skin.  Use a warm, moist towel or a heating pad set on low to relax tight shoulder and neck muscles.  Have someone gently massage your neck and shoulders.  Take pain medicines exactly as directed.  If the doctor gave you a prescription medicine for pain, take it as prescribed.  If you are not taking a prescription pain medicine, ask your doctor if you can take an  over-the-counter medicine. Be careful not to take pain medicine more often than the instructions allow, because you may get worse or more frequent headaches when the medicine wears off.  Do not ignore new symptoms that occur with a headache, such as a fever, weakness or numbness, vision changes, or confusion. These may be signs of a more serious problem.  To prevent headaches  Keep a headache diary so you can figure out what triggers your headaches. Avoiding triggers may help you prevent headaches. Record when each headache began, how long it lasted, and what the pain was like (throbbing, aching, stabbing, or dull). Write down any other symptoms you had with the headache, such as nausea, flashing lights or dark spots, or sensitivity to bright light or loud noise. Note if the headache occurred near your period. List anything that might have triggered the headache, such as certain foods (chocolate, cheese, wine) or odors, smoke, bright light, stress, or lack of sleep.  Find healthy ways to deal with stress. Headaches are most common during or right after stressful times. Take time to relax before and after you do something that has caused a headache in the past.  Try to keep your muscles relaxed by keeping good posture. Check your jaw, face, neck, and shoulder muscles for tension, and try relaxing them. When sitting at a desk, change positions often, and stretch for 30 seconds each hour.    Get plenty of sleep and exercise.  Eat regularly and well. Long periods without food can trigger a headache.  Treat yourself to a massage. Some people find that regular massages are very helpful in relieving tension.  Limit caffeine by not drinking too much coffee, tea, or soda. But don't quit caffeine suddenly, because that can also give you headaches.  Reduce eyestrain from computers by blinking frequently and looking away from the computer screen every so often. Make sure you have proper eyewear and that your monitor  is set up properly, about an arm's length away.  Seek help if you have depression or anxiety. Your headaches may be linked to these conditions. Treatment can both prevent headaches and help with symptoms of anxiety or depression.  

## 2018-02-21 ENCOUNTER — Encounter (HOSPITAL_COMMUNITY): Payer: Self-pay | Admitting: Emergency Medicine

## 2018-02-21 ENCOUNTER — Ambulatory Visit (HOSPITAL_COMMUNITY)
Admission: EM | Admit: 2018-02-21 | Discharge: 2018-02-21 | Disposition: A | Discharge status: Home / Self Care | Payer: 59 | Attending: Family Medicine | Admitting: Family Medicine

## 2018-02-21 DIAGNOSIS — M79642 Pain in left hand: Secondary | ICD-10-CM | POA: Diagnosis not present

## 2018-02-21 MED ORDER — DICLOFENAC SODIUM 75 MG PO TBEC: 75.0000 mg | DELAYED_RELEASE_TABLET | Freq: Two times a day (BID) | ORAL | 0 refills | Status: DC

## 2018-02-21 NOTE — ED Notes (Signed)
Patient able to ambulate independently  

## 2018-02-21 NOTE — ED Triage Notes (Signed)
Pt sts left index finger and hand pain; pt sts difficult to move due to pain; pt denies obvious injury

## 2018-02-27 NOTE — ED Provider Notes (Signed)
[facility]   [id number] 02/21/18 Arrival Time: 1112  ASSESSMENT & PLAN:  1. Left hand pain    Question strain. No injury/trauma. Trial of: Meds ordered this encounter  Medications  . diclofenac (VOLTAREN) 75 MG EC tablet    Sig: Take 1 tablet (75 mg total) by mouth 2 (two) times daily.    Dispense:  14 tablet    Refill:  0   Follow-up Information    Tommie Sams, DO.   Specialty:  Family Medicine Why:  If symptoms worsen or if you are not improving over the next several days. Contact information: [address] Laurell Josephs [address] [place] [address] [phone]           Reviewed expectations re: course of current medical issues. Questions answered. Outlined signs and symptoms indicating need for more acute intervention. Patient verbalized understanding. After Visit Summary given.  SUBJECTIVE: History from: patient. Rita [name] is a 38 y.o. female who reports intermittent mild pain of her left hand; specifically around dorsal hand near second finger; described as aching without radiation. Onset: gradual, several days ago. Injury/trama: no. Symptoms have progressed to a point and plateaued since beginning. Aggravating factors: movement. Alleviating factors: rest. Associated symptoms: none reported. Extremity sensation changes or weakness: none. Self treatment: has not tried OTCs for relief of pain. History of similar: no.  Past Surgical History:  Procedure Laterality Date  . CERVICAL CERCLAGE     for childbirth     ROS: As per HPI.   OBJECTIVE:  Vitals:   02/21/18 1205  BP: 136/83  Pulse: (!) 58  Resp: 18  Temp: 97.6 F (36.4 C)  TempSrc: Temporal  SpO2: 99%    General appearance: alert; no distress Extremities: . LUE: warm and well perfused; poorly localized mild tenderness over left dorsal hand near second finger; without gross deformities; with no swelling; with no bruising; ROM: normal CV: brisk extremity capillary refill of  LUE; 2+ radial pulse of LUE. Skin: warm and dry; no visible rashes Neurologic: gait normal; normal reflexes of RUE and LUE; normal sensation of RUE and LUE; normal strength of RUE and LUE Psychological: alert and cooperative; normal mood and affect  Allergies  Allergen Reactions  . Metronidazole Other (See Comments)    Lips swelling    Past Medical History:  Diagnosis Date  . Chicken pox    Social History   Socioeconomic History  . Marital status: Single    Spouse name: Not on file  . Number of children: Not on file  . Years of education: Not on file  . Highest education level: Not on file  Occupational History  . Not on file  Social Needs  . Financial resource strain: Not on file  . Food insecurity:    Worry: Not on file    Inability: Not on file  . Transportation needs:    Medical: Not on file    Non-medical: Not on file  Tobacco Use  . Smoking status: Never Smoker  . Smokeless tobacco: Never Used  Substance and Sexual Activity  . Alcohol use: No    Alcohol/week: 0.0 standard drinks    Frequency: Never    Comment: Occasional (1 drink 1-2 times/month).  . Drug use: No  . Sexual activity: Yes    Partners: Male  Lifestyle  . Physical activity:    Days per week: Not on file    Minutes per session: Not on file  . Stress: Not on file  Relationships  .  Social connections:    Talks on phone: Not on file    Gets together: Not on file    Attends religious service: Not on file    Active member of club or organization: Not on file    Attends meetings of clubs or organizations: Not on file    Relationship status: Not on file  Other Topics Concern  . Not on file  Social History Narrative  . Not on file   Family History  Problem Relation Age of Onset  . Lung cancer Father   . Uterine cancer Paternal Aunt   . Stroke Maternal Grandmother   . Breast cancer Paternal Grandmother   . Breast cancer Paternal Aunt    Past Surgical History:  Procedure Laterality Date    . CERVICAL CERCLAGE     for childbirth      Mardella Layman, MD 02/27/18 [phone]

## 2018-09-07 IMAGING — CT CT ANGIO HEAD
2 of 11 series · 7 of 34 positions shown · IV contrast (iopamidol)
Comparison: None.

CLINICAL DATA: Headache for 1 year, worse with movement.

EXAM:
CT ANGIOGRAPHY HEAD
TECHNIQUE: Multidetector CT imaging of the head was performed using the
standard protocol during bolus administration of intravenous
contrast. Multiplanar CT image reconstructions and MIPs were
obtained to evaluate the vascular anatomy.
CONTRAST:  75mL 0PN1DG-BKN IOPAMIDOL (0PN1DG-BKN) INJECTION 76%

[Series 4: cta head · axial · 0.42mm/px · z∈[-102,-52]mm · 2 of 76 slices shown]
[im 26/76  soft-tissue]
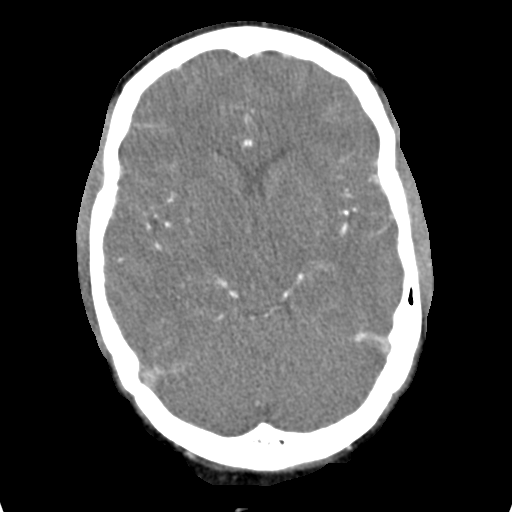
[im 51/76  soft-tissue]
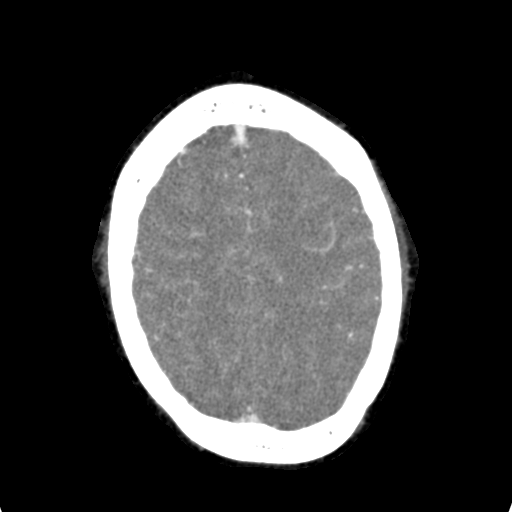

[Series 6: ax thin · axial · 0.36mm/px · z∈[-172,-58]mm · 5 of 176 slices shown]
[im 30/176  soft-tissue]
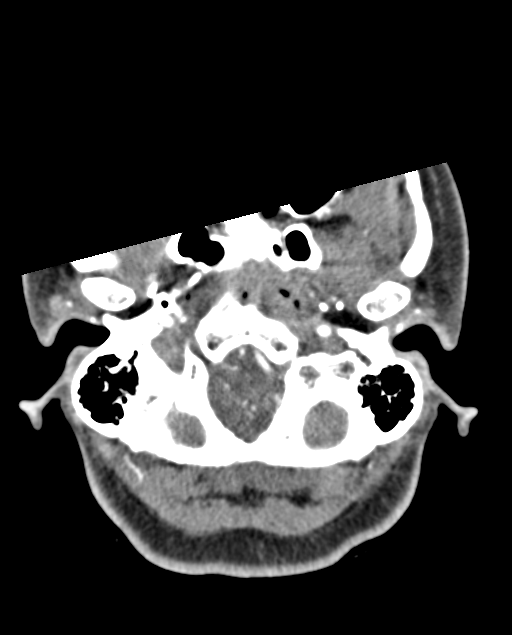
[im 59/176  bone]
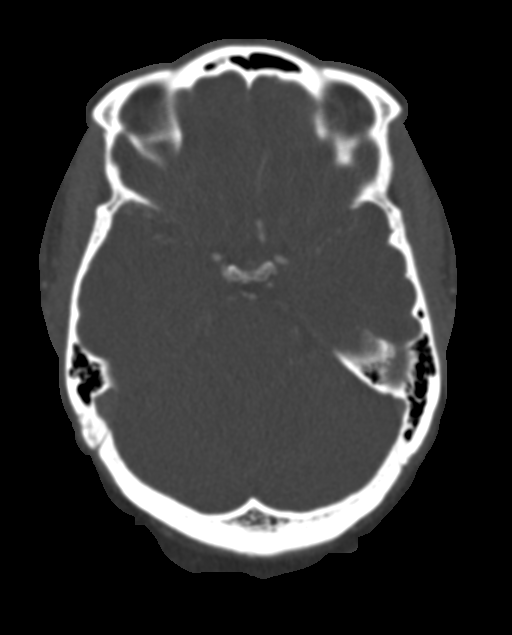
[im 88/176  soft-tissue]
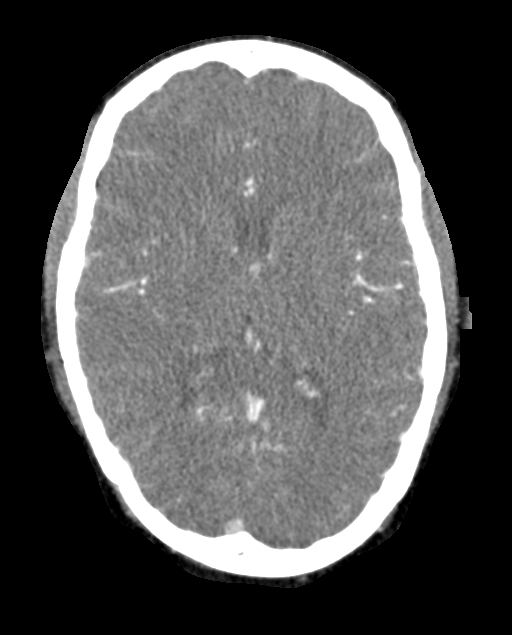
[im 117/176  bone]
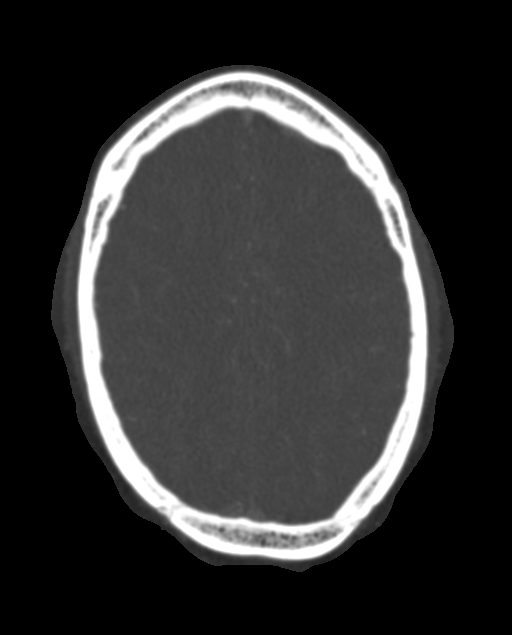
[im 146/176  soft-tissue]
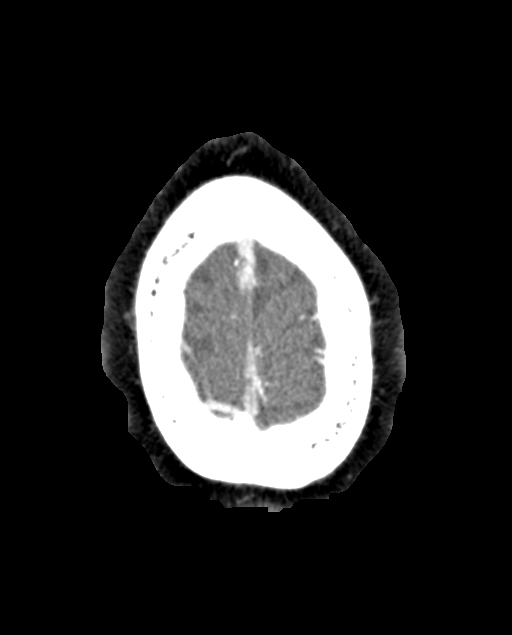

[7 of 34 positions shown; findings below may reference images not displayed]

FINDINGS: CT HEAD

Brain: There is no evidence of acute infarct, intracranial
hemorrhage, mass, midline shift, or extra-axial fluid collection.
The ventricles and sulci are normal. The cerebellar tonsils extend
at most minimally below the foramen magnum, within normal limits.

Vascular: No hyperdense vessel.

Skull: No fracture or focal osseous lesion.

Sinuses: Visualized paranasal sinuses and mastoid air cells are
clear.

Orbits: Unremarkable.

CTA HEAD

Anterior circulation: The internal carotid arteries are widely
patent from skull base to carotid termini. The ACAs and MCAs are
patent without evidence of proximal branch occlusion or significant
stenosis. No aneurysm or vascular malformation is identified.

Posterior circulation: The visualized distal vertebral arteries are
patent to the basilar. Patent PICA and SCA origins are identified
bilaterally. The basilar artery is patent and mildly small in
caliber diffusely on a congenital basis without significant focal
stenosis. There is a patent right posterior communicating artery
with hypoplastic right P1 segment. Both PCAs are patent without
evidence of significant stenosis. No aneurysm or vascular
malformation is identified.

Venous sinuses: As permitted by contrast timing, patent.

Anatomic variants: None of significance.

Delayed phase: No abnormal enhancement.
IMPRESSION: Negative head CTA.

## 2019-01-04 ENCOUNTER — Other Ambulatory Visit: Payer: Self-pay

## 2019-01-04 DIAGNOSIS — Z20822 Contact with and (suspected) exposure to covid-19: Secondary | ICD-10-CM

## 2019-01-06 LAB — NOVEL CORONAVIRUS, NAA: SARS-CoV-2, NAA: NOT DETECTED

## 2019-02-01 NOTE — L&D Delivery Note (Signed)
Operative Delivery Note At  a viable female was delivered via .  Presentation: vertex; Position: Left,, Occiput,, Transverse; Station: +3/+3 Verbal consent: obtained from patient.  Risks and benefits discussed in detail.  Risks include, but are not limited to the risks of anesthesia, bleeding, infection, damage to maternal tissues, fetal cephalhematoma.  There is also the risk of inability to effect vaginal delivery of the head, or shoulder dystocia that cannot be resolved by established maneuvers, leading to the need for emergency cesarean section. Kielland forceps applied in classic maneuver , anterior then posterior  90 degree rotation counterclockwise . Forceps keep on and  Delivery of the head without difficulty on second pull .  Delivery of body after 2 loose nuchal cord were reduced . Vigorous female with delayed cord clamping . True knot noted  Several cm from the fetal side of umbilical cord . Placenta intact   sulcus tear and central tear . Multiple sutures placed and bleeding continued . Marland Kitchen Pt taken to the OR for repair . Massive blood loss protocol  Initiated due to qbl of 2895 cc.  Blood , FFp and platelets have been ordered . Time of delivery 1457 APGAR: 8/9 , ; weight  .  #8/3 Placenta status: intact , .   Cord:  with the following complications:as above  .  Anesthesia:  CLE  Instruments:kielland  Episiotomy:  none Lacerations:  as above  Suture Repair: 2.0 3.0 vicryl see op report  Est. Blood Loss (mL):  qbl 2895 cc   Rita [name] 09/03/2019, 4:43 PM

## 2019-02-08 DIAGNOSIS — Z8742 Personal history of other diseases of the female genital tract: Secondary | ICD-10-CM | POA: Insufficient documentation

## 2019-02-08 DIAGNOSIS — O0991 Supervision of high risk pregnancy, unspecified, first trimester: Secondary | ICD-10-CM | POA: Diagnosis not present

## 2019-02-08 DIAGNOSIS — O0993 Supervision of high risk pregnancy, unspecified, third trimester: Secondary | ICD-10-CM | POA: Insufficient documentation

## 2019-02-08 DIAGNOSIS — Z6841 Body Mass Index (BMI) 40.0 and over, adult: Secondary | ICD-10-CM | POA: Diagnosis not present

## 2019-02-27 NOTE — H&P (Signed)
39 y.o. G2P0101 at  Patient's last menstrual period was 11/04/2018.inconsistent with ultrasound @ [redacted]w[redacted]d .Estimated Date of Delivery: 09/05/2019  Pt is scheduled for a cervical cerclage  Due to an incompetent cervix with prior emergency cerclage placed at 19 weeks  Current EGa on 03/18/2019 is 15+3 weeks . Recent trichomonas infection - treated   Sex of baby and name:  " "                          FOB:      Factors complicating this pregnancy  1. History of preterm birth/incompetent cervix   Emergency cerclage at 19 weeks  PTL leading to precipitous birth at 32 weeks  Cerclage 14-16 weeks , scheduled 03/18/2019 TJS  [x]  completed   Progesterone injections weekly starting between 16-[redacted] weeks gestation to 36 weeks  Can consider vaginal progesterone as alternative  2. Morbid obesity - BMI 50.16 at NOB  Early GTT 109  Baseline CMP, P/C ratio WNL  Anesthesia consult [ ]  3. AMA - 39 years old at delivery   Low dose ASA therapy starting 12-16 weeks  Screening results and needs: ? NOB:   Medicaid Questionnaire:   Depression Score: 4  MBT: O+  Ab screen: Negative  Pap:  HIV: Negative Hep B/RPR:  Negative/Non reactive G/C:   Rubella:  Immune  VZV: Immune ? Aneuploidy:   First trimester: (Did they do cffDNA or NT/blood draw?)  MaternitT21: will do at 10 week visit [ ]  , SMA [ ]    NT:      Second trimester (AFP/tetra):  ? 28 weeks:   Review Medicaid Questionnaire:  Depression Score:  Blood consent:  Hgb:   Platelets:    Glucola:   Rhogam:  ? 36 weeks:   GBS:   G/C:   Hgb:  Platelets:    HIV/RPR:    ? Last [name]:   01/16/19: Uterus anteverted; Single, viable IUP, S=[redacted]w[redacted]d; not CWD - EDD by u/s=09/05/2019; FHR=113bpm; Yolk sac seen; Cervical length=3.74cm; No free fluid seen; B/L ovaries appear wnl    Immunization:    Flu in season -   Tdap at 27-36 weeks -   Contraception Plan:   Feeding Plan:   Lo    Previous Version  History of cervical incompetence  02/08/2019 by Takeyah Wieman, Burman Blacksmith, MD No  Amenorrhea by Morene Crocker, RT No  Well woman exam 07/13/2017 by Vickki Hearing, MD No  Headache disorder 07/13/2017 by Vickki Hearing, MD No  Blighted ovum by Merril Abbe, RT 02/08/2019 by Vern Claude  Prenatal Vitals  No pregnancy episode for this patient could be found.  BP and Weight Measurements as of 02/05/2019   04/20/2016  07/12/2017 11:28 AM 07/12/2017 12:39 PM 08/29/2017  10/06/2017  09/14/2018  01/11/2019  01/16/2019   BP 126/84 121/83 128/90 118/80 126/82 129/92 123/80 126/75  Weight 124.3 kg (274 lb) 130.2 kg (287 lb) 130.6 kg (288 lb) 123.4 kg (272 lb) 122.9 kg (271 lb) 134.7 kg (297 lb) 144.2 kg (318 lb) 143.3 kg (316 lb)  OB Notes      HPI: Past Medical History:  has a past medical history of Blighted ovum. Past Surgical History:   has a past surgical history that includes Cervical Cerclage Vaginal. Past Obstetric History:          OB History    Gravida  3   Para  1   Term  0   Preterm  1   AB  1   Living  1     SAB      TAB  1   Ectopic      Molar      Multiple      Live Births  1         Past Family History: family history includes Breast cancer in her paternal aunt and paternal grandmother; Lung cancer in her father; Ovarian cancer in her maternal aunt. Past Social History:  reports that she has never smoked. She has never used smokeless tobacco. She reports that she does not drink alcohol or use drugs. Allergies: is allergic to flagyl [metronidazole hcl].  Medications:       Current Outpatient Medications on File Prior to Visit  Medication Sig Dispense Refill  . prenatal vitamin with iron-folic acid (PRENATAL TABLETS) tablet Take 1 tablet by mouth once daily    . metFORMIN (GLUCOPHAGE) 500 MG tablet Take 1 tablet (500 mg total) by mouth 2 (two) times daily with meals 60 tablet 11  . nortriptyline (PAMELOR) 10 MG capsule Start Nortriptyline  (Pamelor) 10 mg nightly for one week, then increase to 20 mg nightly (Patient not taking: Reported on 08/29/2017 ) 60 capsule 3   No current facility-administered medications on file prior to visit.        Patient Active Problem List  Diagnosis  . Well woman exam  . Headache disorder  . Amenorrhea  . High-risk pregnancy supervision  . History of cervical incompetence    Review of Systems  Constitutional: Positive for fatigue. Negative for chills.  Gastrointestinal: Positive for nausea. Negative for abdominal pain.  Genitourinary: Negative for difficulty urinating, dysuria, vaginal bleeding and vaginal discharge.  Skin: Negative for rash.  Neurological: Negative for light-headedness and headaches.  Psychiatric/Behavioral: The patient is not nervous/anxious.     Physical Exam Constitutional:      Appearance: Normal appearance.  Genitourinary:     Pelvic exam was performed with patient in the lithotomy position.     Vulva, urethra, bladder, vagina, uterus and rectum normal.     No lesions in the vagina.     No vaginal discharge, erythema or bleeding.     Cervix is parous.     No cervical discharge, friability or erythema.     Right and left adnexa are palpable.  Neck:     Musculoskeletal: Normal range of motion.  Cardiovascular:     Rate and Rhythm: Normal rate and regular rhythm.     Heart sounds: Normal heart sounds.  Pulmonary:     Effort: Pulmonary effort is normal.     Breath sounds: Normal breath sounds.  Chest:     Breasts: Tanner Score is 5.        Right: Normal. No inverted nipple or nipple discharge.        Left: Normal. No inverted nipple or nipple discharge.  Abdominal:     Palpations: Abdomen is soft.     Tenderness: There is no abdominal tenderness.  Neurological:     Mental Status: She is alert and oriented to person, place, and time.  Skin:    General: Skin is warm and dry.     Capillary Refill: Capillary refill takes less than 2 seconds.      Findings: No erythema or rash.  Psychiatric:        Mood and Affect: Mood normal.    Assessment:   39 y.o. G2P0101 @ 15+[redacted] weeks EGA with a h/o  incompetent cervix  P: cerclage placement - mersilene band       Jennell Corner MD   Ob/gyn

## 2019-02-28 DIAGNOSIS — Z202 Contact with and (suspected) exposure to infections with a predominantly sexual mode of transmission: Secondary | ICD-10-CM | POA: Diagnosis not present

## 2019-02-28 DIAGNOSIS — O0991 Supervision of high risk pregnancy, unspecified, first trimester: Secondary | ICD-10-CM | POA: Diagnosis not present

## 2019-03-13 ENCOUNTER — Other Ambulatory Visit: Payer: Self-pay

## 2019-03-13 ENCOUNTER — Encounter
Admission: RE | Admit: 2019-03-13 | Discharge: 2019-03-13 | Disposition: A | Discharge status: Home / Self Care | Payer: Medicaid Other | Source: Ambulatory Visit | Attending: Obstetrics and Gynecology | Admitting: Obstetrics and Gynecology

## 2019-03-13 DIAGNOSIS — Z01818 Encounter for other preprocedural examination: Secondary | ICD-10-CM | POA: Diagnosis not present

## 2019-03-13 HISTORY — DX: Gastro-esophageal reflux disease without esophagitis: K21.9

## 2019-03-13 NOTE — Patient Instructions (Addendum)
Your procedure is scheduled on: Monday 03/18/19.  Report to DAY SURGERY DEPARTMENT LOCATED ON 2ND FLOOR MEDICAL MALL ENTRANCE. To find out your arrival time please call [phone] between 1PM - 3PM on Sunday 03/17/19.   Remember: Instructions that are not followed completely may result in serious medical risk, up to and including death, or upon the discretion of your surgeon and anesthesiologist your surgery may need to be rescheduled.      _X__ 1. Do not eat food after midnight the night before your procedure.                 No gum chewing or hard candies. You may drink clear liquids up to 2 hours                 before you are scheduled to arrive for your surgery- DO NOT drink clear                 liquids within 2 hours of the start of your surgery.                 Clear Liquids include:  water, apple juice without pulp, clear carbohydrate                 drink such as Clearfast or Gatorade, Black Coffee or Tea (Do not add                 anything to coffee or tea).  ** Dr. Feliberto Gottron would like for you to finish the Pre-Surgery Ensure Drink 2 hours before your arrival time on the morning of your surgery. **  __X__2.  On the morning of surgery brush your teeth with toothpaste and water, you may rinse your mouth with mouthwash if you wish.  Do not swallow any toothpaste or mouthwash.      _X__ 3.  No Alcohol for 24 hours before or after surgery.     _X__ 4.  Do Not Smoke or use e-cigarettes For 24 Hours Prior to Your Surgery.                 Do not use any chewable tobacco products for at least 6 hours prior to                 surgery.   __X__5.  Notify your doctor if there is any change in your medical condition      (cold, fever, infections).       Do not wear jewelry, make-up, hairpins, clips or nail polish. Do not wear lotions, powders, or perfumes.  Do not shave 48 hours prior to surgery. Men may shave face and neck. Do not bring valuables to the hospital.       [facility] is not responsible for any belongings or valuables.    Contacts, dentures/partials or body piercings may not be worn into surgery. Bring a case for your contacts, glasses or hearing aids, a denture cup will be supplied.     Patients discharged the day of surgery will not be allowed to drive home.     __X__ Take these medicines the morning of surgery with A SIP OF WATER:     1. NONE     __X__ Shower before arrival.    __X__ Stop Anti-inflammatories 7 days before surgery such as Advil, Ibuprofen, Motrin, BC or Goodies Powder, Naprosyn, Naproxen, Aleve, Aspirin, Meloxicam. May take Tylenol if needed for pain or discomfort.

## 2019-03-14 ENCOUNTER — Other Ambulatory Visit
Admission: RE | Admit: 2019-03-14 | Discharge: 2019-03-14 | Disposition: A | Discharge status: Home / Self Care | Payer: Medicaid Other | Source: Ambulatory Visit | Attending: Surgery | Admitting: Surgery

## 2019-03-14 DIAGNOSIS — Z01812 Encounter for preprocedural laboratory examination: Secondary | ICD-10-CM | POA: Diagnosis not present

## 2019-03-14 DIAGNOSIS — Z20822 Contact with and (suspected) exposure to covid-19: Secondary | ICD-10-CM | POA: Insufficient documentation

## 2019-03-14 LAB — CBC
HCT: 39.2 % (ref 36.0–46.0)
Hemoglobin: 12.7 g/dL (ref 12.0–15.0)
MCH: 25.1 pg — ABNORMAL LOW (ref 26.0–34.0)
MCHC: 32.4 g/dL (ref 30.0–36.0)
MCV: 77.6 fL — ABNORMAL LOW (ref 80.0–100.0)
Platelets: 218 10*3/uL (ref 150–400)
RBC: 5.05 MIL/uL (ref 3.87–5.11)
RDW: 13.5 % (ref 11.5–15.5)
WBC: 5.1 10*3/uL (ref 4.0–10.5)
nRBC: 0 % (ref 0.0–0.2)

## 2019-03-14 LAB — BASIC METABOLIC PANEL
Anion gap: 11 (ref 5–15)
BUN: 6 mg/dL (ref 6–20)
CO2: 21 mmol/L — ABNORMAL LOW (ref 22–32)
Calcium: 8.9 mg/dL (ref 8.9–10.3)
Chloride: 106 mmol/L (ref 98–111)
Creatinine, Ser: 0.35 mg/dL — ABNORMAL LOW (ref 0.44–1.00)
GFR calc Af Amer: >60 mL/min (ref >60)
GFR calc non Af Amer: >60 mL/min (ref >60)
Glucose, Bld: 79 mg/dL (ref 70–99)
Potassium: 3.6 mmol/L (ref 3.5–5.1)
Sodium: 138 mmol/L (ref 135–145)

## 2019-03-14 LAB — SARS CORONAVIRUS 2 (TAT 6-24 HRS): SARS Coronavirus 2: NEGATIVE

## 2019-03-14 LAB — TYPE AND SCREEN
ABO/RH(D): O POS
Antibody Screen: NEGATIVE
Extend sample reason: UNDETERMINED

## 2019-03-18 ENCOUNTER — Encounter
Admission: RE | Disposition: A | Discharge status: Home / Self Care | Payer: Self-pay | Source: Home / Self Care | Attending: Obstetrics and Gynecology

## 2019-03-18 ENCOUNTER — Ambulatory Visit: Payer: Medicaid Other | Admitting: Anesthesiology

## 2019-03-18 ENCOUNTER — Other Ambulatory Visit: Payer: Self-pay

## 2019-03-18 ENCOUNTER — Ambulatory Visit
Admission: RE | Admit: 2019-03-18 | Discharge: 2019-03-18 | Disposition: A | Discharge status: Home / Self Care | Payer: Medicaid Other | Attending: Obstetrics and Gynecology | Admitting: Obstetrics and Gynecology

## 2019-03-18 DIAGNOSIS — O09212 Supervision of pregnancy with history of pre-term labor, second trimester: Secondary | ICD-10-CM | POA: Diagnosis not present

## 2019-03-18 DIAGNOSIS — O09522 Supervision of elderly multigravida, second trimester: Secondary | ICD-10-CM | POA: Diagnosis not present

## 2019-03-18 DIAGNOSIS — K219 Gastro-esophageal reflux disease without esophagitis: Secondary | ICD-10-CM | POA: Diagnosis not present

## 2019-03-18 DIAGNOSIS — Z6835 Body mass index (BMI) 35.0-35.9, adult: Secondary | ICD-10-CM | POA: Diagnosis not present

## 2019-03-18 DIAGNOSIS — O3432 Maternal care for cervical incompetence, second trimester: Secondary | ICD-10-CM | POA: Insufficient documentation

## 2019-03-18 DIAGNOSIS — Z3A15 15 weeks gestation of pregnancy: Secondary | ICD-10-CM | POA: Insufficient documentation

## 2019-03-18 DIAGNOSIS — O99212 Obesity complicating pregnancy, second trimester: Secondary | ICD-10-CM | POA: Insufficient documentation

## 2019-03-18 DIAGNOSIS — N883 Incompetence of cervix uteri: Secondary | ICD-10-CM | POA: Diagnosis not present

## 2019-03-18 DIAGNOSIS — E669 Obesity, unspecified: Secondary | ICD-10-CM | POA: Diagnosis not present

## 2019-03-18 DIAGNOSIS — Z7984 Long term (current) use of oral hypoglycemic drugs: Secondary | ICD-10-CM | POA: Insufficient documentation

## 2019-03-18 HISTORY — PX: CERVICAL CERCLAGE: SHX1329

## 2019-03-18 LAB — TYPE AND SCREEN
ABO/RH(D): O POS
Antibody Screen: NEGATIVE

## 2019-03-18 SURGERY — CERCLAGE, CERVIX, VAGINAL APPROACH
Anesthesia: Spinal

## 2019-03-18 MED ORDER — DEXAMETHASONE SODIUM PHOSPHATE 10 MG/ML IJ SOLN
INTRAMUSCULAR | Status: AC
  Filled 2019-03-18: qty 1

## 2019-03-18 MED ORDER — ONDANSETRON HCL 4 MG/2ML IJ SOLN
INTRAMUSCULAR | Status: DC | PRN
  Administered 2019-03-18: 4 mg via INTRAVENOUS

## 2019-03-18 MED ORDER — PROMETHAZINE HCL 25 MG/ML IJ SOLN
INTRAMUSCULAR | Status: AC
  Administered 2019-03-18: 13:00:00 6.25 mg via INTRAVENOUS
  Filled 2019-03-18: qty 1

## 2019-03-18 MED ORDER — CEFAZOLIN SODIUM-DEXTROSE 2-4 GM/100ML-% IV SOLN
INTRAVENOUS | Status: AC
  Filled 2019-03-18: qty 100

## 2019-03-18 MED ORDER — SUGAMMADEX SODIUM 200 MG/2ML IV SOLN
INTRAVENOUS | Status: AC
  Filled 2019-03-18: qty 2

## 2019-03-18 MED ORDER — SOD CITRATE-CITRIC ACID 500-334 MG/5ML PO SOLN
30.0000 mL | ORAL | Status: AC
  Administered 2019-03-18: 09:00:00 30 mL via ORAL
  Filled 2019-03-18 (×2): qty 30

## 2019-03-18 MED ORDER — FENTANYL CITRATE (PF) 100 MCG/2ML IJ SOLN: 25.0000 ug | INTRAMUSCULAR | Status: DC | PRN

## 2019-03-18 MED ORDER — FENTANYL CITRATE (PF) 100 MCG/2ML IJ SOLN
INTRAMUSCULAR | Status: DC | PRN
  Administered 2019-03-18: 10 ug via INTRATHECAL

## 2019-03-18 MED ORDER — SOD CITRATE-CITRIC ACID 500-334 MG/5ML PO SOLN
30.0000 mL | Freq: Once | ORAL | Status: AC
  Administered 2019-03-18: 30 mL via ORAL
  Filled 2019-03-18: qty 30

## 2019-03-18 MED ORDER — LACTATED RINGERS IV SOLN: INTRAVENOUS | Status: DC

## 2019-03-18 MED ORDER — KETOROLAC TROMETHAMINE 30 MG/ML IJ SOLN
INTRAMUSCULAR | Status: AC
  Filled 2019-03-18: qty 1

## 2019-03-18 MED ORDER — PROPOFOL 10 MG/ML IV BOLUS
INTRAVENOUS | Status: AC
  Filled 2019-03-18: qty 20

## 2019-03-18 MED ORDER — BUPIVACAINE IN DEXTROSE 0.75-8.25 % IT SOLN
INTRATHECAL | Status: DC | PRN
  Administered 2019-03-18: 1 mL via INTRATHECAL

## 2019-03-18 MED ORDER — SODIUM CHLORIDE FLUSH 0.9 % IV SOLN
INTRAVENOUS | Status: AC
  Filled 2019-03-18: qty 10

## 2019-03-18 MED ORDER — ONDANSETRON HCL 4 MG/2ML IJ SOLN
INTRAMUSCULAR | Status: AC
  Filled 2019-03-18: qty 2

## 2019-03-18 MED ORDER — LIDOCAINE HCL (PF) 2 % IJ SOLN
INTRAMUSCULAR | Status: AC
  Filled 2019-03-18: qty 10

## 2019-03-18 MED ORDER — HYDROCODONE-ACETAMINOPHEN 5-325 MG PO TABS: 1.0000 | ORAL_TABLET | ORAL | Status: DC | PRN

## 2019-03-18 MED ORDER — GLYCOPYRROLATE 0.2 MG/ML IJ SOLN
INTRAMUSCULAR | Status: AC
  Filled 2019-03-18: qty 1

## 2019-03-18 MED ORDER — CEFAZOLIN SODIUM-DEXTROSE 2-4 GM/100ML-% IV SOLN
2.0000 g | Freq: Once | INTRAVENOUS | Status: AC
  Administered 2019-03-18: 11:00:00 2 g via INTRAVENOUS

## 2019-03-18 MED ORDER — ACETAMINOPHEN 500 MG PO TABS
1000.0000 mg | ORAL_TABLET | Freq: Once | ORAL | Status: DC | PRN
  Filled 2019-03-18: qty 2

## 2019-03-18 MED ORDER — MIDAZOLAM HCL 2 MG/2ML IJ SOLN
INTRAMUSCULAR | Status: AC
  Filled 2019-03-18: qty 2

## 2019-03-18 MED ORDER — FENTANYL CITRATE (PF) 100 MCG/2ML IJ SOLN
INTRAMUSCULAR | Status: AC
  Filled 2019-03-18: qty 2

## 2019-03-18 MED ORDER — PHENYLEPHRINE HCL (PRESSORS) 10 MG/ML IV SOLN
INTRAVENOUS | Status: DC | PRN
  Administered 2019-03-18: 50 ug via INTRAVENOUS

## 2019-03-18 MED ORDER — PROMETHAZINE HCL 25 MG/ML IJ SOLN: 6.2500 mg | INTRAMUSCULAR | Status: DC | PRN

## 2019-03-18 SURGICAL SUPPLY — 19 items
CANISTER SUCT 1200ML W/VALVE (MISCELLANEOUS) ×2 IMPLANT
CATH ROBINSON RED A/P 16FR (CATHETERS) ×2 IMPLANT
COVER WAND RF STERILE (DRAPES) ×2 IMPLANT
DRAPE UNDER BUTTOCK W/FLU (DRAPES) ×2 IMPLANT
ELECT REM PT RETURN 9FT ADLT (ELECTROSURGICAL) ×2
ELECTRODE REM PT RTRN 9FT ADLT (ELECTROSURGICAL) ×1 IMPLANT
GLOVE BIO SURGEON STRL SZ8 (GLOVE) ×2 IMPLANT
GOWN STRL REUS W/ TWL LRG LVL3 (GOWN DISPOSABLE) ×1 IMPLANT
GOWN STRL REUS W/ TWL XL LVL3 (GOWN DISPOSABLE) ×1 IMPLANT
GOWN STRL REUS W/TWL LRG LVL3 (GOWN DISPOSABLE) ×1
GOWN STRL REUS W/TWL XL LVL3 (GOWN DISPOSABLE) ×1
KIT TURNOVER CYSTO (KITS) ×2 IMPLANT
LABEL OR SOLS (LABEL) ×2 IMPLANT
NS IRRIG 500ML POUR BTL (IV SOLUTION) ×2 IMPLANT
PACK BASIN MINOR ARMC (MISCELLANEOUS) ×2 IMPLANT
PAD OB MATERNITY 4.3X12.25 (PERSONAL CARE ITEMS) ×2 IMPLANT
PAD PREP 24X41 OB/GYN DISP (PERSONAL CARE ITEMS) ×2 IMPLANT
SURGILUBE 2OZ TUBE FLIPTOP (MISCELLANEOUS) ×2 IMPLANT
TAPE MERSILENE 5MM 36 OS-8 WHT (SUTURE) ×2 IMPLANT

## 2019-03-18 NOTE — Brief Op Note (Signed)
03/18/2019  11:41 AM  PATIENT:  Rita [name]  39 y.o. female  PRE-OPERATIVE DIAGNOSIS:  incompetent cervix 15+[redacted] weeks EGA POST-OPERATIVE DIAGNOSIS:  incompetent cervix Same as above PROCEDURE:  Procedure(s): CERCLAGE CERVICAL (N/A)  SURGEON:  Surgeon(s) and Role:    * Dorella Laster, Ihor Austin, MD - Primary  PHYSICIAN ASSISTANT:   ASSISTANTS: none   ANESTHESIA:   spinal  EBL:  10 mL   BLOOD ADMINISTERED:none  DRAINS: none   LOCAL MEDICATIONS USED:  NONE  SPECIMEN:  No Specimen  DISPOSITION OF SPECIMEN:  N/A  COUNTS:  YES  TOURNIQUET:  * No tourniquets in log *  DICTATION: .Other Dictation: Dictation Number verbal  PLAN OF CARE: Discharge to home after PACU  PATIENT DISPOSITION:  PACU - hemodynamically stable.   Delay start of Pharmacological VTE agent (>24hrs) due to surgical blood loss or risk of bleeding: not applicable

## 2019-03-18 NOTE — Discharge Instructions (Signed)

## 2019-03-18 NOTE — Op Note (Signed)
NAME: Rita [name], Rita [name] MEDICAL RECORD EH:[id number] ACCOUNT [id number] DATE OF BIRTH:May 07, 1980 FACILITY: [facility] LOCATION: [facility] PHYSICIAN:Katlynne Mckercher Cloyde Reams, MD  OPERATIVE REPORT  DATE OF PROCEDURE:  03/18/2019  PREOPERATIVE DIAGNOSES: 1.  15+3 weeks estimated gestational age. 2.  History of incompetent cervix.  POSTOPERATIVE DIAGNOSES:   1.  15+3 weeks estimated gestational age. 2.  History of incompetent cervix.  PROCEDURE:  McDonald cervical cerclage placement.  ANESTHESIA:  Spinal.  SURGEON:  Jennell Corner, MD  INDICATIONS:  This is a 39 year old gravida 3, para 1 patient with a prior history of an incompetent cervix, undergoing an emergency cerclage and a preterm delivery at 32 weeks.  DESCRIPTION OF PROCEDURE:  After adequate spinal anesthesia, the patient was placed in dorsal supine position with the legs in the candy cane stirrups.  The lower abdomen, perineum, and vagina were prepped and draped in normal sterile fashion.  Timeout  was performed.  The patient did receive 2 g of IV Ancef prior to commencement of the case.  Straight catheterization of the bladder yielded 150 mL of clear urine.  A weighted speculum was placed in the posterior vaginal vault and the anterior cervix was  elevated with a Sims retractor.  Sidewall retractors were used to aid in visualization.  The anterior cervix was grasped with an Allis clamp and the 5 mm Mersilene band was placed at the 12 o'clock, exited at the 10 o'clock position, then placed at the 8  o'clock position, exited at the 6 o'clock position, placed at the 5 o'clock position, exited at the 4 o'clock position, entered at the 2 o'clock position and exited at the 12 o'clock position.  This was done in a cloverleaf fashion with avoidance of the  lateral cervical branches of the uterine artery.  The suture was then tied at 12 o'clock with 5 knots and the Mersilene bands were cut long to be identified later.  A small amount  of bleeding was controlled with mild pressure with the sponge stick.   There were no complications.  The patient tolerated the procedure well.  INTRAOPERATIVE FLUIDS:  300 mL.  ESTIMATED BLOOD LOSS:  10 mL.  URINE OUTPUT:  150 mL.    Ultrasound was performed at the end of the case with fetal heart rate of 140 documented.  DISPOSITION:  The patient was taken to recovery room in good condition.  VN/NUANCE  D:03/18/2019 T:03/18/2019 JOB:[id number]

## 2019-03-18 NOTE — Progress Notes (Signed)
Pt. Able to stand without assistance. Pt. Still unsteady upon trying to walk and was safely returned to stretcher.

## 2019-03-18 NOTE — Transfer of Care (Signed)
Immediate Anesthesia Transfer of Care Note  Patient: Rita [name]  Procedure(s) Performed: CERCLAGE CERVICAL (N/A )  Patient Location: PACU  Anesthesia Type:Spinal  Level of Consciousness: awake, alert  and oriented  Airway & Oxygen Therapy: Patient Spontanous Breathing  Post-op Assessment: Report given to RN and Post -op Vital signs reviewed and stable  Post vital signs: Reviewed  Last Vitals:  Vitals Value Taken Time  BP 109/54 03/18/19 1141  Temp    Pulse 62 03/18/19 1141  Resp 16 03/18/19 1141  SpO2 100 % 03/18/19 1141  Vitals shown include unvalidated device data.  Last Pain:  Vitals:   03/18/19 0911  TempSrc: Tympanic  PainSc: 0-No pain         Complications: No apparent anesthesia complications

## 2019-03-18 NOTE — Progress Notes (Signed)
FHR by u/s 135

## 2019-03-18 NOTE — OR Nursing (Signed)
Pt. Able to ambulate without difficulty. Pt. Able to sit in wheelchair without difficulty.

## 2019-03-18 NOTE — Anesthesia Procedure Notes (Signed)
Spinal  Patient location during procedure: OR Staffing Performed: resident/CRNA  Anesthesiologist: Tera Mater, MD Resident/CRNA: Rolla Plate, CRNA Preanesthetic Checklist Completed: patient identified, IV checked, site marked, risks and benefits discussed, surgical consent, monitors and equipment checked, pre-op evaluation and timeout performed Spinal Block Patient position: sitting Prep: ChloraPrep and site prepped and draped Patient monitoring: heart rate, continuous pulse ox, blood pressure and cardiac monitor Approach: midline Location: L4-5 Injection technique: single-shot Needle Needle type: Whitacre  Needle gauge: 22 G Needle length: 12.7 cm Additional Notes Negative paresthesia. Negative blood return. Positive free-flowing CSF. Expiration date of kit checked and confirmed. Patient tolerated procedure well, without complications.

## 2019-03-18 NOTE — Anesthesia Preprocedure Evaluation (Addendum)
Anesthesia Evaluation  Patient identified by MRN, date of birth, ID band Patient awake    Reviewed: Allergy & Precautions, H&P , NPO status , Patient's Chart, lab work & pertinent test results  Airway Mallampati: II  TM Distance: >3 FB Neck ROM: full    Dental  (+) Teeth Intact   Pulmonary neg pulmonary ROS,           Cardiovascular Exercise Tolerance: Good negative cardio ROS       Neuro/Psych    GI/Hepatic GERD  ,  Endo/Other  Morbid obesity (BMI 50)  Renal/GU   negative genitourinary   Musculoskeletal   Abdominal   Peds  Hematology negative hematology ROS (+)   Anesthesia Other Findings Past Medical History: No date: Chicken pox No date: GERD (gastroesophageal reflux disease)  Past Surgical History: No date: CERVICAL CERCLAGE     Comment:  for childbirth     Reproductive/Obstetrics (+) Pregnancy                            Anesthesia Physical Anesthesia Plan  ASA: III  Anesthesia Plan: Spinal   Post-op Pain Management:    Induction:   PONV Risk Score and Plan:   Airway Management Planned: Nasal Cannula and Natural Airway  Additional Equipment:   Intra-op Plan:   Post-operative Plan:   Informed Consent: I have reviewed the patients History and Physical, chart, labs and discussed the procedure including the risks, benefits and alternatives for the proposed anesthesia with the patient or authorized representative who has indicated his/her understanding and acceptance.     Dental Advisory Given  Plan Discussed with: Anesthesiologist  Anesthesia Plan Comments:        Anesthesia Quick Evaluation

## 2019-03-18 NOTE — Progress Notes (Signed)
Ready for cervical cerclage  Labs viewed  All questions answered

## 2019-03-18 NOTE — Progress Notes (Signed)
Patient given incentive spirometer. Patient educated and demonstrated use of incentive spirometer with goal .

## 2019-03-18 NOTE — Progress Notes (Signed)
Can bend legs  Numbness to toes and legs

## 2019-03-20 NOTE — Anesthesia Postprocedure Evaluation (Signed)
Anesthesia Post Note  Patient: Rita [name]  Procedure(s) Performed: CERCLAGE CERVICAL (N/A )  Patient location during evaluation: PACU Anesthesia Type: Spinal Level of consciousness: oriented and awake and alert Pain management: pain level controlled Vital Signs Assessment: post-procedure vital signs reviewed and stable Respiratory status: spontaneous breathing, respiratory function stable and patient connected to nasal cannula oxygen Cardiovascular status: blood pressure returned to baseline and stable Postop Assessment: no headache, no backache and spinal receding Anesthesia complication: nausea, improved with Phenergan.     Last Vitals:  Vitals:   03/18/19 1252 03/18/19 1550  BP: (!) 112/53 (!) 113/54  Pulse: (!) 47 (!) 55  Resp: 17 18  Temp: (!) 36.2 C (!) 36.3 C  SpO2: 98%     Last Pain:  Vitals:   03/18/19 1550  TempSrc:   PainSc: 0-No pain                 Rita [name] Rita [name]

## 2019-03-27 DIAGNOSIS — O09299 Supervision of pregnancy with other poor reproductive or obstetric history, unspecified trimester: Secondary | ICD-10-CM | POA: Diagnosis not present

## 2019-03-27 DIAGNOSIS — O09892 Supervision of other high risk pregnancies, second trimester: Secondary | ICD-10-CM | POA: Diagnosis not present

## 2019-03-27 DIAGNOSIS — Z8742 Personal history of other diseases of the female genital tract: Secondary | ICD-10-CM | POA: Diagnosis not present

## 2019-03-27 DIAGNOSIS — Z6841 Body Mass Index (BMI) 40.0 and over, adult: Secondary | ICD-10-CM | POA: Diagnosis not present

## 2019-03-27 DIAGNOSIS — O0992 Supervision of high risk pregnancy, unspecified, second trimester: Secondary | ICD-10-CM | POA: Diagnosis not present

## 2019-04-02 DIAGNOSIS — O09299 Supervision of pregnancy with other poor reproductive or obstetric history, unspecified trimester: Secondary | ICD-10-CM | POA: Diagnosis not present

## 2019-04-08 ENCOUNTER — Other Ambulatory Visit: Payer: Self-pay

## 2019-04-08 ENCOUNTER — Encounter
Admission: RE | Admit: 2019-04-08 | Discharge: 2019-04-08 | Disposition: A | Discharge status: Home / Self Care | Payer: Medicaid Other | Source: Ambulatory Visit | Attending: Anesthesiology | Admitting: Anesthesiology

## 2019-04-08 NOTE — Consult Note (Signed)
[facility] Anesthesia Consultation  BERRY GALLACHER ZSW:[id number] DOB: 1980-06-26 DOA: 04/08/2019 PCP: Coral Spikes, DO   Requesting physician: Dr. Ouida Sills Date of consultation: 04/08/19 Reason for consultation: Obesity during pregnancy  CHIEF COMPLAINT:  Obesity during pregnancy  HISTORY OF PRESENT ILLNESS: Rosalva Neary  is a 39 y.o. female with a known history of obesity during pregnancy. Prior vaginal delivery with epidural labor analgesia 9 yrs ago. Had cerclage placed at [redacted]w[redacted]d gestation. Denies hx of cardiovascular disease. Denies hx of asthma. Denies personal or family hx of bleeding disorders.   PAST MEDICAL HISTORY:   Past Medical History:  Diagnosis Date  . Chicken pox   . GERD (gastroesophageal reflux disease)     PAST SURGICAL HISTORY:  Past Surgical History:  Procedure Laterality Date  . CERVICAL CERCLAGE     for childbirth  . CERVICAL CERCLAGE N/A 03/18/2019   Procedure: CERCLAGE CERVICAL;  Surgeon: Ouida Sills Gwen Her, MD;  Location: [facility] ORS;  Service: Gynecology;  Laterality: N/A;    SOCIAL HISTORY:  Social History   Tobacco Use  . Smoking status: Never Smoker  . Smokeless tobacco: Never Used  Substance Use Topics  . Alcohol use: No    Alcohol/week: 0.0 standard drinks    Comment: Occasional (1 drink 1-2 times/month).    FAMILY HISTORY:  Family History  Problem Relation Age of Onset  . Lung cancer Father   . Uterine cancer Paternal Aunt   . Stroke Maternal Grandmother   . Breast cancer Paternal Grandmother   . Breast cancer Paternal Aunt     DRUG ALLERGIES:  Allergies  Allergen Reactions  . Metronidazole Other (See Comments)    Lips swelling    REVIEW OF SYSTEMS:   RESPIRATORY: No cough, shortness of breath, wheezing.  CARDIOVASCULAR: No chest pain, orthopnea, edema.  HEMATOLOGY: No anemia, easy bruising or bleeding SKIN: No rash or lesion. NEUROLOGIC: No tingling, numbness, weakness.   PSYCHIATRY: No anxiety or depression.   MEDICATIONS AT HOME:  Prior to Admission medications   Medication Sig Start Date End Date Taking? Authorizing Provider  acetaminophen (TYLENOL) 325 MG tablet Take 650 mg by mouth every 6 (six) hours as needed (pain.).    [provider]  prenatal vitamin w/FE, FA (PRENATAL 1 + 1) 27-1 MG TABS tablet Take 1 tablet by mouth daily at 12 noon.    [provider]      PHYSICAL EXAMINATION:   VITAL SIGNS: There were no vitals taken for this visit.  GENERAL:  39 y.o.-year-old patient no acute distress.  HEENT: Head atraumatic, normocephalic. Oropharynx and nasopharynx clear. MP 1, TM distance >3 cm, normal mouth opening, grade 1 upper lip bite. LUNGS: Normal breath sounds bilaterally, no wheezing, rales,rhonchi. No use of accessory muscles of respiration.  CARDIOVASCULAR: S1, S2 normal. No murmurs, rubs, or gallops.  EXTREMITIES: No pedal edema, cyanosis, or clubbing.  NEUROLOGIC: normal gait PSYCHIATRIC: The patient is alert and oriented x 3.  SKIN: No obvious rash, lesion, or ulcer.    IMPRESSION AND PLAN:   Nikeya Maxim  is a 39 y.o. female presenting with obesity during pregnancy. BMI is currently 49 at [redacted] weeks gestation.   Airway exam reassuring. Spinal interspaces palpable.   We discussed analgesic options during labor including epidural analgesia. Discussed that in obesity there can be increased difficulty with epidural placement or even failure of successful epidural. We also discussed that even after successful epidural placement there is increased risk of catheter migration out of the  epidural space that would require catheter replacement. Discussed use of epidural vs spinal vs GA if cesarean delivery is required. Discussed increased risk of difficult intubation during pregnancy should an emergency cesarean delivery be required.   We discussed repeat evaluation at 34-35 weeks by anesthesia to determine whether there is a  high risk of complications of anesthesia for which we would recommend transfer of OB care to a facility with a higher maternal level of care designation.

## 2019-04-09 DIAGNOSIS — O09299 Supervision of pregnancy with other poor reproductive or obstetric history, unspecified trimester: Secondary | ICD-10-CM | POA: Diagnosis not present

## 2019-04-17 DIAGNOSIS — O09299 Supervision of pregnancy with other poor reproductive or obstetric history, unspecified trimester: Secondary | ICD-10-CM | POA: Diagnosis not present

## 2019-04-24 DIAGNOSIS — O09299 Supervision of pregnancy with other poor reproductive or obstetric history, unspecified trimester: Secondary | ICD-10-CM | POA: Diagnosis not present

## 2019-05-01 DIAGNOSIS — N898 Other specified noninflammatory disorders of vagina: Secondary | ICD-10-CM | POA: Diagnosis not present

## 2019-05-01 DIAGNOSIS — A5901 Trichomonal vulvovaginitis: Secondary | ICD-10-CM | POA: Diagnosis not present

## 2019-05-01 DIAGNOSIS — O09299 Supervision of pregnancy with other poor reproductive or obstetric history, unspecified trimester: Secondary | ICD-10-CM | POA: Diagnosis not present

## 2019-05-01 DIAGNOSIS — B373 Candidiasis of vulva and vagina: Secondary | ICD-10-CM | POA: Diagnosis not present

## 2019-05-08 DIAGNOSIS — O09299 Supervision of pregnancy with other poor reproductive or obstetric history, unspecified trimester: Secondary | ICD-10-CM | POA: Diagnosis not present

## 2019-05-15 DIAGNOSIS — O09299 Supervision of pregnancy with other poor reproductive or obstetric history, unspecified trimester: Secondary | ICD-10-CM | POA: Diagnosis not present

## 2019-05-23 DIAGNOSIS — O09299 Supervision of pregnancy with other poor reproductive or obstetric history, unspecified trimester: Secondary | ICD-10-CM | POA: Diagnosis not present

## 2019-05-23 DIAGNOSIS — O09212 Supervision of pregnancy with history of pre-term labor, second trimester: Secondary | ICD-10-CM | POA: Diagnosis not present

## 2019-05-23 DIAGNOSIS — Z8751 Personal history of pre-term labor: Secondary | ICD-10-CM | POA: Diagnosis not present

## 2019-06-03 DIAGNOSIS — O26899 Other specified pregnancy related conditions, unspecified trimester: Secondary | ICD-10-CM | POA: Diagnosis not present

## 2019-06-03 DIAGNOSIS — R519 Headache, unspecified: Secondary | ICD-10-CM | POA: Diagnosis not present

## 2019-06-04 DIAGNOSIS — O09299 Supervision of pregnancy with other poor reproductive or obstetric history, unspecified trimester: Secondary | ICD-10-CM | POA: Diagnosis not present

## 2019-06-11 DIAGNOSIS — Z8751 Personal history of pre-term labor: Secondary | ICD-10-CM | POA: Diagnosis not present

## 2019-06-11 DIAGNOSIS — O09299 Supervision of pregnancy with other poor reproductive or obstetric history, unspecified trimester: Secondary | ICD-10-CM | POA: Diagnosis not present

## 2019-06-16 ENCOUNTER — Other Ambulatory Visit: Payer: Self-pay

## 2019-06-16 ENCOUNTER — Observation Stay
Admission: EM | Admit: 2019-06-16 | Discharge: 2019-06-16 | Disposition: A | Discharge status: Home / Self Care | Payer: Medicaid Other | Attending: Obstetrics and Gynecology | Admitting: Obstetrics and Gynecology

## 2019-06-16 ENCOUNTER — Encounter: Payer: Self-pay | Admitting: Obstetrics and Gynecology

## 2019-06-16 DIAGNOSIS — Z3A28 28 weeks gestation of pregnancy: Secondary | ICD-10-CM | POA: Diagnosis not present

## 2019-06-16 DIAGNOSIS — O09523 Supervision of elderly multigravida, third trimester: Secondary | ICD-10-CM | POA: Diagnosis not present

## 2019-06-16 DIAGNOSIS — K219 Gastro-esophageal reflux disease without esophagitis: Secondary | ICD-10-CM | POA: Diagnosis not present

## 2019-06-16 DIAGNOSIS — O0993 Supervision of high risk pregnancy, unspecified, third trimester: Secondary | ICD-10-CM | POA: Diagnosis not present

## 2019-06-16 DIAGNOSIS — O26893 Other specified pregnancy related conditions, third trimester: Principal | ICD-10-CM | POA: Insufficient documentation

## 2019-06-16 DIAGNOSIS — O99213 Obesity complicating pregnancy, third trimester: Secondary | ICD-10-CM | POA: Diagnosis not present

## 2019-06-16 DIAGNOSIS — O99613 Diseases of the digestive system complicating pregnancy, third trimester: Secondary | ICD-10-CM | POA: Diagnosis not present

## 2019-06-16 DIAGNOSIS — N898 Other specified noninflammatory disorders of vagina: Secondary | ICD-10-CM | POA: Diagnosis not present

## 2019-06-16 DIAGNOSIS — Z0371 Encounter for suspected problem with amniotic cavity and membrane ruled out: Secondary | ICD-10-CM | POA: Diagnosis not present

## 2019-06-16 LAB — RUPTURE OF MEMBRANE (ROM)PLUS: Rom Plus: NEGATIVE

## 2019-06-16 LAB — URINALYSIS, COMPLETE (UACMP) WITH MICROSCOPIC
Bilirubin Urine: NEGATIVE
Glucose, UA: NEGATIVE mg/dL
Hgb urine dipstick: NEGATIVE
Ketones, ur: NEGATIVE mg/dL
Nitrite: NEGATIVE
Protein, ur: 30 mg/dL — AB
Specific Gravity, Urine: 1.024 (ref 1.005–1.030)
pH: 6 (ref 5.0–8.0)

## 2019-06-16 LAB — WET PREP, GENITAL
Clue Cells Wet Prep HPF POC: NONE SEEN
Sperm: NONE SEEN
Trich, Wet Prep: NONE SEEN
Yeast Wet Prep HPF POC: NONE SEEN

## 2019-06-16 LAB — CHLAMYDIA/NGC RT PCR (ARMC ONLY)
Chlamydia Tr: NOT DETECTED
N gonorrhoeae: NOT DETECTED

## 2019-06-16 LAB — FETAL FIBRONECTIN: Fetal Fibronectin: NEGATIVE

## 2019-06-16 NOTE — Discharge Summary (Signed)
Rita [name] is a 39 y.o. female. She is at [redacted]w[redacted]d gestation. Patient's last menstrual period was 11/04/2018 (approximate). Estimated Date of Delivery: 09/05/19  Prenatal care site: [facility]   Current pregnancy complicated by:  1. Cervical insufficiency with G1, cerclage placed at 15wks this preg 2. Hx preterm birth, precipitous labor at birth at 32wks, on weekly 17P injections 3. Morbid obesity, BMI 50, on daily baby ASA.  4. Advanced maternal age, 57wo at delivery  Chief complaint: 2-3 days of leaking fluid.  Location: vagina Onset/timing: no large gush, feels occasional trickle.  Duration: constant since about Friday, not needing a pad.  Quality: dampness up to a teaspoon of clear odorless fluid.  Severity:  Aggravating or alleviating conditions: no recent IC, treated for Trich about 7mos ago.  Associated signs/symptoms: denies cramping,VB.  Context: n/a  S: Resting comfortably. no CTX, no VB.no LOF,  Active fetal movement.  Denies: HA, visual changes, SOB, or RUQ/epigastric pain  Maternal Medical History:   Past Medical History:  Diagnosis Date  . Chicken pox   . GERD (gastroesophageal reflux disease)     Past Surgical History:  Procedure Laterality Date  . CERVICAL CERCLAGE     for childbirth  . CERVICAL CERCLAGE N/A 03/18/2019   Procedure: CERCLAGE CERVICAL;  Surgeon: Ouida Sills Gwen Her, MD;  Location: [facility] ORS;  Service: Gynecology;  Laterality: N/A;    Allergies  Allergen Reactions  . Metronidazole Other (See Comments)    Lips swelling    Prior to Admission medications   Medication Sig Start Date End Date Taking? Authorizing Provider  acetaminophen (TYLENOL) 325 MG tablet Take 650 mg by mouth every 6 (six) hours as needed (pain.).   Yes [provider]  prenatal vitamin w/FE, FA (PRENATAL 1 + 1) 27-1 MG TABS tablet Take 1 tablet by mouth daily at 12 noon.   Yes [provider]      Social History: She  reports that she  has never smoked. She has never used smokeless tobacco. She reports that she does not drink alcohol or use drugs.  Family History: family history includes Breast cancer in her paternal aunt and paternal grandmother; Lung cancer in her father; Stroke in her maternal grandmother; Uterine cancer in her paternal aunt.   Review of Systems: A full review of systems was performed and negative except as noted in the HPI.     O:  BP (!) 129/59 (BP Location: Right Arm)   Pulse 84   Temp 98.4 F (36.9 C) (Oral)   Resp 16   LMP 11/04/2018 (Approximate)  Results for orders placed or performed during the hospital encounter of 06/16/19 (from the past 48 hour(s))  Wet prep, genital   Collection Time: 06/16/19  8:38 PM  Result Value Ref Range   Yeast Wet Prep HPF POC NONE SEEN NONE SEEN   Trich, Wet Prep NONE SEEN NONE SEEN   Clue Cells Wet Prep HPF POC NONE SEEN NONE SEEN   WBC, Wet Prep HPF POC MANY (A) NONE SEEN   Sperm NONE SEEN   ROM Plus ([facility] only)   Collection Time: 06/16/19  8:38 PM  Result Value Ref Range   Rom Plus NEGATIVE   Urinalysis, Complete w Microscopic   Collection Time: 06/16/19  8:38 PM  Result Value Ref Range   Color, Urine YELLOW (A) YELLOW   APPearance CLEAR (A) CLEAR   Specific Gravity, Urine 1.024 1.005 - 1.030   pH 6.0 5.0 - 8.0  Glucose, UA NEGATIVE NEGATIVE mg/dL   Hgb urine dipstick NEGATIVE NEGATIVE   Bilirubin Urine NEGATIVE NEGATIVE   Ketones, ur NEGATIVE NEGATIVE mg/dL   Protein, ur 30 (A) NEGATIVE mg/dL   Nitrite NEGATIVE NEGATIVE   Leukocytes,Ua TRACE (A) NEGATIVE   RBC / HPF 0-5 0 - 5 RBC/hpf   WBC, UA 0-5 0 - 5 WBC/hpf   Bacteria, UA RARE (A) NONE SEEN   Squamous Epithelial / LPF 0-5 0 - 5   Mucus PRESENT   Fetal fibronectin   Collection Time: 06/16/19  8:44 PM  Result Value Ref Range   Fetal Fibronectin NEGATIVE NEGATIVE     Constitutional: NAD, AAOx3  HE/ENT: extraocular movements grossly intact, moist mucous membranes CV: RRR PULM: nl  respiratory effort, CTABL     Abd: gravid, non-tender, non-distended, soft      Ext: Non-tender, Nonedematous   Psych: mood appropriate, speech normal Pelvic: SSE done: visually closed, no cervical discharge or friability. Cerclage intact, knot at 1200. Mod amt white discharge noted, no odor, no erythema.    Fetal  monitoring: Cat I Appropriate for GA with Reactive NST Baseline: 140bpm Variability: moderate Accelerations: present x >2, 10*10 Decelerations: mild variable noted x 1  TOCO: occasional UI  Time    A/P: 39 y.o. [redacted]w[redacted]d here for antenatal surveillance for suspected ROM, not found.   Principle Diagnosis:  High risk pregnancy in third trimester, suspected ROM, not found, 28wks   Preterm labor: not present. FFN neg  Fetal Wellbeing: Reassuring Cat 1 tracing with reactive NST   Neg wet prep, neg ROM Plus  Pending GC/CT- low risk  UA negative, encouraged PO hydration. .   D/c home stable, precautions reviewed, follow-up as scheduled.    Randa Ngo, CNM 06/16/2019  10:10 PM

## 2019-06-16 NOTE — OB Triage Note (Signed)
Pt G2P1 who present after 2-3 days of leaking fluid. 28.3 weeks with cerclage placed at 15 weeks for hx incompetent cervix. Pt has not been wearing a pad and reports dampness to a teaspoon of fluid that is clear and odorless. Pt denies fever or feeling ill. No acute distress.Pt takes 17P shots weekly

## 2019-06-20 DIAGNOSIS — O09299 Supervision of pregnancy with other poor reproductive or obstetric history, unspecified trimester: Secondary | ICD-10-CM | POA: Diagnosis not present

## 2019-06-20 DIAGNOSIS — O0992 Supervision of high risk pregnancy, unspecified, second trimester: Secondary | ICD-10-CM | POA: Diagnosis not present

## 2019-06-20 DIAGNOSIS — A5901 Trichomonal vulvovaginitis: Secondary | ICD-10-CM | POA: Diagnosis not present

## 2019-06-27 DIAGNOSIS — O09299 Supervision of pregnancy with other poor reproductive or obstetric history, unspecified trimester: Secondary | ICD-10-CM | POA: Diagnosis not present

## 2019-06-27 DIAGNOSIS — Z8742 Personal history of other diseases of the female genital tract: Secondary | ICD-10-CM | POA: Diagnosis not present

## 2019-07-04 DIAGNOSIS — Z23 Encounter for immunization: Secondary | ICD-10-CM | POA: Diagnosis not present

## 2019-07-04 DIAGNOSIS — O0993 Supervision of high risk pregnancy, unspecified, third trimester: Secondary | ICD-10-CM | POA: Diagnosis not present

## 2019-07-04 DIAGNOSIS — O09299 Supervision of pregnancy with other poor reproductive or obstetric history, unspecified trimester: Secondary | ICD-10-CM | POA: Diagnosis not present

## 2019-07-04 DIAGNOSIS — Z8742 Personal history of other diseases of the female genital tract: Secondary | ICD-10-CM | POA: Diagnosis not present

## 2019-07-12 DIAGNOSIS — O09299 Supervision of pregnancy with other poor reproductive or obstetric history, unspecified trimester: Secondary | ICD-10-CM | POA: Diagnosis not present

## 2019-07-12 DIAGNOSIS — Z8751 Personal history of pre-term labor: Secondary | ICD-10-CM | POA: Diagnosis not present

## 2019-07-17 DIAGNOSIS — O09299 Supervision of pregnancy with other poor reproductive or obstetric history, unspecified trimester: Secondary | ICD-10-CM | POA: Diagnosis not present

## 2019-07-24 DIAGNOSIS — Z8751 Personal history of pre-term labor: Secondary | ICD-10-CM | POA: Diagnosis not present

## 2019-07-24 DIAGNOSIS — O09299 Supervision of pregnancy with other poor reproductive or obstetric history, unspecified trimester: Secondary | ICD-10-CM | POA: Diagnosis not present

## 2019-07-24 DIAGNOSIS — Z8742 Personal history of other diseases of the female genital tract: Secondary | ICD-10-CM | POA: Diagnosis not present

## 2019-07-24 DIAGNOSIS — O0993 Supervision of high risk pregnancy, unspecified, third trimester: Secondary | ICD-10-CM | POA: Diagnosis not present

## 2019-08-01 DIAGNOSIS — O0993 Supervision of high risk pregnancy, unspecified, third trimester: Secondary | ICD-10-CM | POA: Diagnosis not present

## 2019-08-01 DIAGNOSIS — O09299 Supervision of pregnancy with other poor reproductive or obstetric history, unspecified trimester: Secondary | ICD-10-CM | POA: Diagnosis not present

## 2019-08-01 DIAGNOSIS — Z8742 Personal history of other diseases of the female genital tract: Secondary | ICD-10-CM | POA: Diagnosis not present

## 2019-08-07 DIAGNOSIS — O26843 Uterine size-date discrepancy, third trimester: Secondary | ICD-10-CM | POA: Diagnosis not present

## 2019-08-08 DIAGNOSIS — N760 Acute vaginitis: Secondary | ICD-10-CM | POA: Diagnosis not present

## 2019-08-08 DIAGNOSIS — N898 Other specified noninflammatory disorders of vagina: Secondary | ICD-10-CM | POA: Diagnosis not present

## 2019-08-08 DIAGNOSIS — O09299 Supervision of pregnancy with other poor reproductive or obstetric history, unspecified trimester: Secondary | ICD-10-CM | POA: Diagnosis not present

## 2019-08-08 DIAGNOSIS — B9689 Other specified bacterial agents as the cause of diseases classified elsewhere: Secondary | ICD-10-CM | POA: Diagnosis not present

## 2019-08-08 DIAGNOSIS — O0993 Supervision of high risk pregnancy, unspecified, third trimester: Secondary | ICD-10-CM | POA: Diagnosis not present

## 2019-08-12 ENCOUNTER — Other Ambulatory Visit: Admission: RE | Admit: 2019-08-12 | Payer: Medicaid Other | Source: Ambulatory Visit

## 2019-08-14 ENCOUNTER — Other Ambulatory Visit: Payer: Self-pay | Admitting: Obstetrics and Gynecology

## 2019-08-19 ENCOUNTER — Other Ambulatory Visit: Payer: Self-pay

## 2019-08-19 ENCOUNTER — Encounter
Admission: RE | Admit: 2019-08-19 | Discharge: 2019-08-19 | Disposition: A | Discharge status: Home / Self Care | Payer: Medicaid Other | Source: Ambulatory Visit | Attending: Anesthesiology | Admitting: Anesthesiology

## 2019-08-19 NOTE — Consult Note (Signed)
[facility] Anesthesia Consultation  Rita [name] [id number]:[id number] DOB: 06/13/1980 DOA: 08/19/2019 PCP: Tommie Sams, DO   Requesting physician: Dr. Feliberto Gottron Date of consultation: 08/19/19 Reason for consultation: Obesity during pregnancy  CHIEF COMPLAINT:  Obesity during pregnancy  HISTORY OF PRESENT ILLNESS: Rita [name]  is a 39 y.o. female with a known history of obesity during pregnancy. No new pregnancy problems since prior visit. No elevated BP, no gDM. Had cerclage removed at 36 weeks. Currently has induction scheduled for 09/03/19.  PAST MEDICAL HISTORY:   Past Medical History:  Diagnosis Date  . Chicken pox   . GERD (gastroesophageal reflux disease)     PAST SURGICAL HISTORY:  Past Surgical History:  Procedure Laterality Date  . CERVICAL CERCLAGE     for childbirth  . CERVICAL CERCLAGE N/A 03/18/2019   Procedure: CERCLAGE CERVICAL;  Surgeon: Feliberto Gottron Ihor Austin, MD;  Location: [facility] ORS;  Service: Gynecology;  Laterality: N/A;    SOCIAL HISTORY:  Social History   Tobacco Use  . Smoking status: Never Smoker  . Smokeless tobacco: Never Used  Substance Use Topics  . Alcohol use: No    Alcohol/week: 0.0 standard drinks    Comment: Occasional (1 drink 1-2 times/month).    FAMILY HISTORY:  Family History  Problem Relation Age of Onset  . Lung cancer Father   . Uterine cancer Paternal Aunt   . Stroke Maternal Grandmother   . Breast cancer Paternal Grandmother   . Breast cancer Paternal Aunt     DRUG ALLERGIES:  Allergies  Allergen Reactions  . Metronidazole Other (See Comments)    Lips swelling    REVIEW OF SYSTEMS:   RESPIRATORY: No cough, shortness of breath, wheezing.  CARDIOVASCULAR: No chest pain, orthopnea, edema.  HEMATOLOGY: No anemia, easy bruising or bleeding SKIN: No rash or lesion. NEUROLOGIC: No tingling, numbness, weakness.  PSYCHIATRY: No anxiety or depression.   MEDICATIONS AT HOME:  Prior  to Admission medications   Medication Sig Start Date End Date Taking? Authorizing Provider  acetaminophen (TYLENOL) 325 MG tablet Take 650 mg by mouth every 6 (six) hours as needed (pain.).    [provider]  prenatal vitamin w/FE, FA (PRENATAL 1 + 1) 27-1 MG TABS tablet Take 1 tablet by mouth daily at 12 noon.    [provider]      PHYSICAL EXAMINATION:   VITAL SIGNS: Last menstrual period 11/04/2018.  GENERAL:  39 y.o.-year-old patient no acute distress.  HEENT: Head atraumatic, normocephalic. Oropharynx and nasopharynx clear. MP 2, TM distance >3 cm, normal mouth opening. LUNGS: No use of accessory muscles of respiration.  EXTREMITIES: No pedal edema, cyanosis, or clubbing.  NEUROLOGIC: normal gait PSYCHIATRIC: The patient is alert and oriented x 3.  SKIN: No obvious rash, lesion, or ulcer.    IMPRESSION AND PLAN:   Rita [name]  is a 39 y.o. female presenting with obesity during pregnancy. BMI is currently 51 at [redacted] weeks gestation. Planning for vaginal delivery, desires epidural for labor analgesia.  Airway exam reassuring. Significant back adiposity, spinal interspaces minimally palpable.  We discussed analgesic options during labor including epidural analgesia. Discussed that in obesity there can be increased difficulty with epidural placement or even failure of successful epidural. We also discussed that even after successful epidural placement there is increased risk of catheter migration out of the epidural space that would require catheter replacement. Discussed use of epidural vs spinal vs GA if cesarean delivery is required. Discussed increased risk of difficult  intubation during pregnancy should an emergency cesarean delivery be required.   We discussed repeat evaluation at 35-36 weeks by anesthesia to determine whether there is a high risk of complications of anesthesia for which we would recommend transfer of OB care to a facility with a higher  maternal level of care designation.

## 2019-08-22 DIAGNOSIS — B370 Candidal stomatitis: Secondary | ICD-10-CM | POA: Diagnosis not present

## 2019-08-22 DIAGNOSIS — Z03818 Encounter for observation for suspected exposure to other biological agents ruled out: Secondary | ICD-10-CM | POA: Diagnosis not present

## 2019-08-22 DIAGNOSIS — J029 Acute pharyngitis, unspecified: Secondary | ICD-10-CM | POA: Diagnosis not present

## 2019-08-23 DIAGNOSIS — N898 Other specified noninflammatory disorders of vagina: Secondary | ICD-10-CM | POA: Diagnosis not present

## 2019-08-23 DIAGNOSIS — O163 Unspecified maternal hypertension, third trimester: Secondary | ICD-10-CM | POA: Diagnosis not present

## 2019-08-23 DIAGNOSIS — Z331 Pregnant state, incidental: Secondary | ICD-10-CM | POA: Diagnosis not present

## 2019-08-27 DIAGNOSIS — O1493 Unspecified pre-eclampsia, third trimester: Secondary | ICD-10-CM | POA: Diagnosis not present

## 2019-08-29 ENCOUNTER — Observation Stay
Admission: EM | Admit: 2019-08-29 | Discharge: 2019-08-30 | Disposition: A | Discharge status: Home / Self Care | Payer: Medicaid Other

## 2019-08-29 ENCOUNTER — Encounter: Payer: Self-pay | Admitting: Obstetrics & Gynecology

## 2019-08-29 DIAGNOSIS — Z803 Family history of malignant neoplasm of breast: Secondary | ICD-10-CM | POA: Insufficient documentation

## 2019-08-29 DIAGNOSIS — O99613 Diseases of the digestive system complicating pregnancy, third trimester: Secondary | ICD-10-CM | POA: Diagnosis not present

## 2019-08-29 DIAGNOSIS — Z881 Allergy status to other antibiotic agents status: Secondary | ICD-10-CM | POA: Diagnosis not present

## 2019-08-29 DIAGNOSIS — K219 Gastro-esophageal reflux disease without esophagitis: Secondary | ICD-10-CM | POA: Diagnosis not present

## 2019-08-29 DIAGNOSIS — O368190 Decreased fetal movements, unspecified trimester, not applicable or unspecified: Secondary | ICD-10-CM | POA: Diagnosis present

## 2019-08-29 DIAGNOSIS — O368130 Decreased fetal movements, third trimester, not applicable or unspecified: Secondary | ICD-10-CM | POA: Diagnosis not present

## 2019-08-29 DIAGNOSIS — Z823 Family history of stroke: Secondary | ICD-10-CM | POA: Insufficient documentation

## 2019-08-29 DIAGNOSIS — Z8049 Family history of malignant neoplasm of other genital organs: Secondary | ICD-10-CM | POA: Diagnosis not present

## 2019-08-29 DIAGNOSIS — Z801 Family history of malignant neoplasm of trachea, bronchus and lung: Secondary | ICD-10-CM | POA: Insufficient documentation

## 2019-08-29 DIAGNOSIS — Z3A39 39 weeks gestation of pregnancy: Secondary | ICD-10-CM | POA: Insufficient documentation

## 2019-08-29 NOTE — OB Triage Note (Signed)
Pt reports to unit c/o decreased fetal movement since around 1600 this afternoon. Pt states she has been out in the sun all day for her son's baseball game and has not felt baby move. Pt denies ctx, LOF, or vaginal bleeding. External monitors applied, initial FHTs 150s. Will continue to monitor.

## 2019-08-30 ENCOUNTER — Other Ambulatory Visit
Admission: RE | Admit: 2019-08-30 | Discharge: 2019-08-30 | Disposition: A | Discharge status: Home / Self Care | Payer: Medicaid Other | Source: Ambulatory Visit

## 2019-08-30 ENCOUNTER — Other Ambulatory Visit: Payer: Self-pay

## 2019-08-30 DIAGNOSIS — Z01812 Encounter for preprocedural laboratory examination: Secondary | ICD-10-CM | POA: Diagnosis present

## 2019-08-30 DIAGNOSIS — O368130 Decreased fetal movements, third trimester, not applicable or unspecified: Secondary | ICD-10-CM | POA: Diagnosis not present

## 2019-08-30 DIAGNOSIS — U071 COVID-19: Secondary | ICD-10-CM | POA: Insufficient documentation

## 2019-08-30 LAB — SARS CORONAVIRUS 2 (TAT 6-24 HRS): SARS Coronavirus 2: POSITIVE — AB

## 2019-08-30 NOTE — Discharge Summary (Signed)
Rita [name] is a 39 y.o. female. She is at [redacted]w[redacted]d gestation. Patient's last menstrual period was 11/04/2018 (approximate). Estimated Date of Delivery: 09/05/19  Prenatal care site: [facility]  Chief complaint: decreased fetal movement   Rita [name] presented to L&D with complaints of decreased fetal movement today since about 1600.  She reports being out today and at son's baseball game.  Tried eating and drinking water, felt some movement but not her normal.    S: Resting comfortably. no CTX, no VB.no LOF  Maternal Medical History:  Past Medical Hx:  has a past medical history of Chicken pox and GERD (gastroesophageal reflux disease).    Past Surgical Hx:  has a past surgical history that includes Cervical cerclage and Cervical cerclage (N/A, 03/18/2019).   Allergies  Allergen Reactions  . Metronidazole Other (See Comments)    Lips swelling     Prior to Admission medications   Medication Sig Start Date End Date Taking? Authorizing Provider  prenatal vitamin w/FE, FA (PRENATAL 1 + 1) 27-1 MG TABS tablet Take 1 tablet by mouth daily at 12 noon.   Yes [provider]  acetaminophen (TYLENOL) 325 MG tablet Take 650 mg by mouth every 6 (six) hours as needed (pain.).    [provider]    Social History: She  reports that she has never smoked. She has never used smokeless tobacco. She reports that she does not drink alcohol and does not use drugs.  Family History: family history includes Breast cancer in her paternal aunt and paternal grandmother; Lung cancer in her father; Stroke in her maternal grandmother; Uterine cancer in her paternal aunt.   Review of Systems: A full review of systems was performed and negative except as noted in the HPI.    O:  BP (!) 137/68 (BP Location: Left Arm)   Pulse 103   Temp 98.5 F (36.9 C) (Oral)   Resp 18   Ht 5\' 6"  (1.676 m)   Wt (!) 142.9 kg   LMP 11/04/2018 (Approximate)   BMI 50.84 kg/m  No results found for this or  any previous visit (from the past 48 hour(s)).   Constitutional: NAD, AAOx3  HE/ENT: extraocular movements grossly intact, moist mucous membranes CV: RRR PULM: nl respiratory effort, CTABL     Abd: gravid, non-tender, non-distended, soft      Ext: Non-tender, Nonedmeatous   Psych: mood appropriate, speech normal Pelvic deferred  NST:  Baseline: 150 Variability: moderate Accelerations present x >2 Decelerations absent Time 01/04/2019  Assessment: 39 y.o. [redacted]w[redacted]d here for antenatal surveillance during pregnancy.  Principle diagnosis: Reactive NST, reassuring fetal status   Plan:  Labor: not present.   Fetal Wellbeing: Reassuring Cat 1 tracing.  Reactive NST   Kick counts reviewed, feeling more fetal movement   D/c home stable, precautions reviewed, follow-up as scheduled.   ----- [redacted]w[redacted]d, CNM Certified Nurse Midwife [facility]

## 2019-08-30 NOTE — OB Triage Note (Signed)
Discharge instructions and labor precautions reviewed with patient and mother of patient. Patient verbalized understanding. Patient discharged home ambulatory.

## 2019-09-02 ENCOUNTER — Other Ambulatory Visit: Payer: Self-pay

## 2019-09-02 ENCOUNTER — Inpatient Hospital Stay
Admission: EM | Admit: 2019-09-02 | Discharge: 2019-09-05 | DRG: 805 | Disposition: A | Discharge status: Home / Self Care | Payer: Medicaid Other | Attending: Obstetrics and Gynecology | Admitting: Obstetrics and Gynecology

## 2019-09-02 ENCOUNTER — Encounter: Payer: Self-pay | Admitting: Obstetrics and Gynecology

## 2019-09-02 DIAGNOSIS — O368130 Decreased fetal movements, third trimester, not applicable or unspecified: Principal | ICD-10-CM | POA: Diagnosis present

## 2019-09-02 DIAGNOSIS — U071 COVID-19: Secondary | ICD-10-CM | POA: Diagnosis not present

## 2019-09-02 DIAGNOSIS — Z7982 Long term (current) use of aspirin: Secondary | ICD-10-CM

## 2019-09-02 DIAGNOSIS — O9081 Anemia of the puerperium: Secondary | ICD-10-CM | POA: Diagnosis not present

## 2019-09-02 DIAGNOSIS — Z3A39 39 weeks gestation of pregnancy: Secondary | ICD-10-CM

## 2019-09-02 DIAGNOSIS — O9852 Other viral diseases complicating childbirth: Secondary | ICD-10-CM | POA: Diagnosis present

## 2019-09-02 DIAGNOSIS — D62 Acute posthemorrhagic anemia: Secondary | ICD-10-CM | POA: Diagnosis not present

## 2019-09-02 DIAGNOSIS — O99214 Obesity complicating childbirth: Secondary | ICD-10-CM | POA: Diagnosis present

## 2019-09-02 DIAGNOSIS — O9853 Other viral diseases complicating the puerperium: Secondary | ICD-10-CM | POA: Diagnosis present

## 2019-09-02 LAB — CBC
HCT: 40.8 % (ref 36.0–46.0)
Hemoglobin: 13.1 g/dL (ref 12.0–15.0)
MCH: 25.3 pg — ABNORMAL LOW (ref 26.0–34.0)
MCHC: 32.1 g/dL (ref 30.0–36.0)
MCV: 78.9 fL — ABNORMAL LOW (ref 80.0–100.0)
Platelets: 192 10*3/uL (ref 150–400)
RBC: 5.17 MIL/uL — ABNORMAL HIGH (ref 3.87–5.11)
RDW: 14.1 % (ref 11.5–15.5)
WBC: 3 10*3/uL — ABNORMAL LOW (ref 4.0–10.5)
nRBC: 0 % (ref 0.0–0.2)

## 2019-09-02 LAB — OB RESULTS CONSOLE VARICELLA ZOSTER ANTIBODY, IGG: Varicella: IMMUNE

## 2019-09-02 LAB — OB RESULTS CONSOLE GBS: GBS: NEGATIVE

## 2019-09-02 LAB — OB RESULTS CONSOLE HIV ANTIBODY (ROUTINE TESTING): HIV: NONREACTIVE

## 2019-09-02 LAB — OB RESULTS CONSOLE GC/CHLAMYDIA
Chlamydia: NEGATIVE
Gonorrhea: NEGATIVE

## 2019-09-02 LAB — OB RESULTS CONSOLE HEPATITIS B SURFACE ANTIGEN: Hepatitis B Surface Ag: NEGATIVE

## 2019-09-02 LAB — OB RESULTS CONSOLE RPR: RPR: NONREACTIVE

## 2019-09-02 LAB — OB RESULTS CONSOLE RUBELLA ANTIBODY, IGM: Rubella: IMMUNE

## 2019-09-02 MED ORDER — LIDOCAINE HCL (PF) 1 % IJ SOLN
INTRAMUSCULAR | Status: AC
  Administered 2019-09-03: 30 mL via SUBCUTANEOUS
  Filled 2019-09-02: qty 30

## 2019-09-02 MED ORDER — BUTORPHANOL TARTRATE 1 MG/ML IJ SOLN: 1.0000 mg | INTRAMUSCULAR | Status: DC | PRN

## 2019-09-02 MED ORDER — ONDANSETRON HCL 4 MG/2ML IJ SOLN
4.0000 mg | Freq: Four times a day (QID) | INTRAMUSCULAR | Status: DC | PRN
  Administered 2019-09-03: 4 mg via INTRAVENOUS
  Filled 2019-09-02: qty 2

## 2019-09-02 MED ORDER — OXYTOCIN-SODIUM CHLORIDE 30-0.9 UT/500ML-% IV SOLN
2.5000 [IU]/h | INTRAVENOUS | Status: DC
  Administered 2019-09-03: 2.5 [IU]/h via INTRAVENOUS
  Filled 2019-09-02 (×2): qty 500

## 2019-09-02 MED ORDER — OXYCODONE-ACETAMINOPHEN 5-325 MG PO TABS: 1.0000 | ORAL_TABLET | ORAL | Status: DC | PRN

## 2019-09-02 MED ORDER — OXYTOCIN 10 UNIT/ML IJ SOLN
INTRAMUSCULAR | Status: AC
  Filled 2019-09-02: qty 2

## 2019-09-02 MED ORDER — MISOPROSTOL 200 MCG PO TABS
ORAL_TABLET | ORAL | Status: AC
  Filled 2019-09-02: qty 4

## 2019-09-02 MED ORDER — OXYTOCIN BOLUS FROM INFUSION
333.0000 mL | Freq: Once | INTRAVENOUS | Status: AC
  Administered 2019-09-03: 333 mL via INTRAVENOUS

## 2019-09-02 MED ORDER — ACETAMINOPHEN 325 MG PO TABS: 650.0000 mg | ORAL_TABLET | ORAL | Status: DC | PRN

## 2019-09-02 MED ORDER — SOD CITRATE-CITRIC ACID 500-334 MG/5ML PO SOLN: 30.0000 mL | ORAL | Status: DC | PRN

## 2019-09-02 MED ORDER — DM-GUAIFENESIN ER 30-600 MG PO TB12
1.0000 | ORAL_TABLET | Freq: Two times a day (BID) | ORAL | Status: DC
  Administered 2019-09-02 – 2019-09-03 (×2): 1 via ORAL
  Filled 2019-09-02 (×4): qty 1

## 2019-09-02 MED ORDER — OXYCODONE-ACETAMINOPHEN 5-325 MG PO TABS: 2.0000 | ORAL_TABLET | ORAL | Status: DC | PRN

## 2019-09-02 MED ORDER — LIDOCAINE HCL (PF) 1 % IJ SOLN: 30.0000 mL | INTRAMUSCULAR | Status: AC | PRN

## 2019-09-02 MED ORDER — LACTATED RINGERS IV SOLN: INTRAVENOUS | Status: DC

## 2019-09-02 MED ORDER — AMMONIA AROMATIC IN INHA
RESPIRATORY_TRACT | Status: AC
  Filled 2019-09-02: qty 10

## 2019-09-02 MED ORDER — LACTATED RINGERS IV SOLN
500.0000 mL | INTRAVENOUS | Status: DC | PRN
  Administered 2019-09-03: 250 mL via INTRAVENOUS

## 2019-09-02 NOTE — Consult Note (Addendum)
Medical Consultation   Rita [name]  EPP:[id number]  DOB: [birth year]  DOA: 09/02/2019  PCP: Tommie Sams, DO   Requesting physician: Margaretmary Eddy, Certified nurse midwife  Reason for consultation: Covid 19 infection, symptomatic   History of Present Illness: Rita [name] is an 39 y.o. female with history of morbid obesity, G2P0 at [redacted]w[redacted]d, who presented to labor and delivery with concerns for decreased fetal movement.  Patient tested positive for COVID-19 on 08/30/2019 after developing symptoms on 08/27/2019.  Symptoms consist of a dry cough, with mild shortness of breath after spell of coughing, easily relieved by sipping on water as well as nasal congestion.  She has no shortness of breath with activities or ambulation.  She denies loss of taste or smell, denies fever or chills, denies nausea vomiting diarrhea or abdominal pain.  States her husband tested positive a few days prior.  She did not receive the Covid vaccines.  She states her symptoms have not worsened over the past couple days.  She maintains good appetite and states that apart from the symptoms mentioned above she feels like she is in her usual state of health.  Regarding her labile, she is not presently experiencing contractions.  According to the nurse midwife, she is at 3 cm dilation, she will be observed overnight with fetal monitor and tocometer and may be induced in the a.m. if she does not advance.    Review of Systems:  Review of Systems  All other systems reviewed and are negative.  As per HPI otherwise  systems negative.   Past Medical History: Past Medical History:  Diagnosis Date  . Chicken pox   . GERD (gastroesophageal reflux disease)     Past Surgical History: Past Surgical History:  Procedure Laterality Date  . CERVICAL CERCLAGE     for childbirth  . CERVICAL CERCLAGE N/A 03/18/2019   Procedure: CERCLAGE CERVICAL;  Surgeon: Feliberto Gottron Ihor Austin, MD;  Location: [facility] ORS;  Service:  Gynecology;  Laterality: N/A;     Allergies:   Allergies  Allergen Reactions  . Metronidazole Other (See Comments) and Rash    Lips swelling Lips swelling     Social History:  reports that she has never smoked. She has never used smokeless tobacco. She reports that she does not drink alcohol and does not use drugs.   Family History: Family History  Problem Relation Age of Onset  . Lung cancer Father   . Uterine cancer Paternal Aunt   . Stroke Maternal Grandmother   . Breast cancer Paternal Grandmother   . Breast cancer Paternal Aunt     Unacceptable: Noncontributory, unremarkable, or negative. Acceptable: Family history reviewed and not pertinent (If you reviewed it)   Physical Exam: Vitals:   09/02/19 1825 09/02/19 2007  BP: 137/82   Pulse: 91   Resp: 18 16  Temp: 98.4 F (36.9 C) 98.1 F (36.7 C)  TempSrc: Oral Oral  SpO2:  99%  Weight:  (!) 142.9 kg  Height:  5\' 6"  (1.676 m)    Constitutional:  Alert and awake, oriented x3, conversing easily Eyes: PERLA, EOMI, irises appear normal, anicteric sclera,  ENMT: external ears and nose appear normal,            Lips appears normal, oropharynx mucosa, tongue, posterior pharynx appear normal  Neck: neck appears normal, no masses, normal ROM, no thyromegaly, no JVD  CVS: S1-S2 clear, no murmur  rubs or gallops, no LE edema, normal pedal pulses  Respiratory:  clear to auscultation bilaterally, no wheezing, rales or rhonchi. Respiratory effort normal. No accessory muscle use.  Abdomen: Gravid uterus, nontender Musculoskeletal: : no cyanosis, clubbing or edema noted bilaterally  Neuro: Cranial nerves II-XII intact, strength, sensation, reflexes Psych: judgement and insight appear normal, stable mood and affect, mental status Skin: no rashes or lesions or ulcers, no induration or nodules  Data reviewed:  I have personally reviewed following labs and imaging studies Labs:  CBC: Recent Labs  Lab 09/02/19 1935  WBC  3.0*  HGB 13.1  HCT 40.8  MCV 78.9*  PLT 192    Basic Metabolic Panel: No results for input(s): NA, K, CL, CO2, GLUCOSE, BUN, CREATININE, CALCIUM, MG, PHOS in the last 168 hours. GFR CrCl cannot be calculated (Patient's most recent lab result is older than the maximum 21 days allowed.). Liver Function Tests: No results for input(s): AST, ALT, ALKPHOS, BILITOT, PROT, ALBUMIN in the last 168 hours. No results for input(s): LIPASE, AMYLASE in the last 168 hours. No results for input(s): AMMONIA in the last 168 hours. Coagulation profile No results for input(s): INR, PROTIME in the last 168 hours.  Cardiac Enzymes: No results for input(s): CKTOTAL, CKMB, CKMBINDEX, TROPONINI in the last 168 hours. BNP: Invalid input(s): POCBNP CBG: No results for input(s): GLUCAP in the last 168 hours. D-Dimer No results for input(s): DDIMER in the last 72 hours. Hgb A1c No results for input(s): HGBA1C in the last 72 hours. Lipid Profile No results for input(s): CHOL, HDL, LDLCALC, TRIG, CHOLHDL, LDLDIRECT in the last 72 hours. Thyroid function studies No results for input(s): TSH, T4TOTAL, T3FREE, THYROIDAB in the last 72 hours.  Invalid input(s): FREET3 Anemia work up No results for input(s): VITAMINB12, FOLATE, FERRITIN, TIBC, IRON, RETICCTPCT in the last 72 hours. Urinalysis    Component Value Date/Time   COLORURINE YELLOW (A) 06/16/2019 2038   APPEARANCEUR CLEAR (A) 06/16/2019 2038   APPEARANCEUR Clear 04/26/2011 1031   LABSPEC 1.024 06/16/2019 2038   LABSPEC 1.026 04/26/2011 1031   PHURINE 6.0 06/16/2019 2038   GLUCOSEU NEGATIVE 06/16/2019 2038   GLUCOSEU Negative 04/26/2011 1031   HGBUR NEGATIVE 06/16/2019 2038   BILIRUBINUR NEGATIVE 06/16/2019 2038   BILIRUBINUR Negative 04/26/2011 1031   KETONESUR NEGATIVE 06/16/2019 2038   PROTEINUR 30 (A) 06/16/2019 2038   NITRITE NEGATIVE 06/16/2019 2038   LEUKOCYTESUR TRACE (A) 06/16/2019 2038   LEUKOCYTESUR Negative 04/26/2011 1031      Microbiology Recent Results (from the past 240 hour(s))  SARS CORONAVIRUS 2 (TAT 6-24 HRS) Nasopharyngeal Nasopharyngeal Swab     Status: Abnormal   Collection Time: 08/30/19 12:47 PM   Specimen: Nasopharyngeal Swab  Result Value Ref Range Status   SARS Coronavirus 2 POSITIVE (A) NEGATIVE Final    Comment: (NOTE) SARS-CoV-2 target nucleic acids are DETECTED.  The SARS-CoV-2 RNA is generally detectable in upper and lower respiratory specimens during the acute phase of infection. Positive results are indicative of the presence of SARS-CoV-2 RNA. Clinical correlation with patient history and other diagnostic information is  necessary to determine patient infection status. Positive results do not rule out bacterial infection or co-infection with other viruses.  The expected result is Negative.  Fact Sheet for Patients: HairSlick.no  Fact Sheet for Healthcare Providers: quierodirigir.com  This test is not yet approved or cleared by the [place] FDA and  has been authorized for detection and/or diagnosis of SARS-CoV-2 by FDA under an Emergency Use Authorization (  EUA). This EUA will remain  in effect (meaning this test can be used) for the duration of the COVID-19 declaration under Section 564(b)(1) of the Act, 21 U. S.C. section 360bbb-3(b)(1), unless the authorization is terminated or revoked sooner.   Performed at [facility], [address], [place], [place] [address]        Inpatient Medications:   Scheduled Meds: . ammonia      . dextromethorphan-guaiFENesin  1 tablet Oral BID  . lidocaine (PF)      . misoprostol      . oxytocin      . oxytocin 40 units in LR 1000 mL  333 mL Intravenous Once   Continuous Infusions: . lactated ringers    . lactated ringers    . oxytocin       Radiological Exams on Admission: No results found.  Impression/Recommendations    Mild COVID-19 virus  respiratory tract illness in pregnancy -Patient [redacted]w[redacted]d gestation, unvaccinated testing positive for Covid on 08/30/19, symptoms started 08/27/19 -Symptoms: Patient has no difficulty breathing and able to speak effortlessly.  Has mild symptoms including mild cough, congestion.  No fever or hypoxia. -Comorbities: Patient is morbidly obese but has no other comorbid conditions such as HTN, DM, asthma, heart, liver or lung disease on immunosuppressive medications though she is morbidly obese and her liver is not preterm -Risk: Mild with low to moderate risk to progress -Recommendations continue symptomatic care including oral hydration and rest with close monitoring for development of any new symptoms such as shortness of breath, fever, chills, headache, sore throat, new loss of taste or smell, fatigue, body aches, nausea vomiting diarrhea - recommend TOC consult to arrange for Regeneron/ monoclonal antibodies --tylenol for pain, fever -No need at this time for remdesivir or Decadron or inpatient COVID-19 care  -Routine obstetric care,       Thank you for this consultation.  Will sign off  Time Spent: 40  Andris Baumann M.D. [facility] 09/02/2019, 9:13 PM

## 2019-09-02 NOTE — H&P (Signed)
OB History & Physical   History of Present Illness:  Chief Complaint:   HPI:  Rita [name] is a 39 y.o. G31P0101 female at [redacted]w[redacted]d dated by LMP.  She presents to L&D for decreased fetal movement.   She has felt fetal movement off and on through out the day but has been less frequent and decreased overall.  She denies regular contractions, LOF, vaginal bleeding.  Tested positive for covid-19 on 7/30 spouse tested positive 3 days prior to her test.  Reports congestion, fatigue, and cough.   Pregnancy Issues: 1. Covid-19 infection in pregnancy  2. Cervical insufficiency with G1, cerclage placed at 15 weeks, removed at 36 weeks  3. History of preterm birth, precipitous labor a 32 weeks, completed weekly 17P injections  4. Morbid obesity, BMI 50, on daily baby ASA 5. Advanced maternal age, 89 y/o at time of delivery  6. Trichomonas infection in pregnancy - TOC negative   Patient Active Problem List   Diagnosis Date Noted  . Indication for care in labor or delivery 09/02/2019  . Decreased fetal movement 08/29/2019  . Labor and delivery, indication for care 06/16/2019  . Back pain 04/20/2015  . Pain of right scapula 04/20/2015  . Obesity (BMI 35.0-39.9 without comorbidity) 04/20/2015  . Preventative health care 04/20/2015     Maternal Medical History:   Past Medical History:  Diagnosis Date  . Chicken pox   . GERD (gastroesophageal reflux disease)     Past Surgical History:  Procedure Laterality Date  . CERVICAL CERCLAGE     for childbirth  . CERVICAL CERCLAGE N/A 03/18/2019   Procedure: CERCLAGE CERVICAL;  Surgeon: Feliberto Gottron Ihor Austin, MD;  Location: [facility] ORS;  Service: Gynecology;  Laterality: N/A;    Allergies  Allergen Reactions  . Metronidazole Other (See Comments)    Lips swelling    Prior to Admission medications   Medication Sig Start Date End Date Taking? Authorizing Provider  acetaminophen (TYLENOL) 325 MG tablet Take 650 mg by mouth every 6 (six) hours as  needed (pain.).   Yes [provider]  prenatal vitamin w/FE, FA (PRENATAL 1 + 1) 27-1 MG TABS tablet Take 1 tablet by mouth daily at 12 noon.   Yes [provider]     Prenatal care site:  [facility]   Social History: She  reports that she has never smoked. She has never used smokeless tobacco. She reports that she does not drink alcohol and does not use drugs.  Family History: family history includes Breast cancer in her paternal aunt and paternal grandmother; Lung cancer in her father; Stroke in her maternal grandmother; Uterine cancer in her paternal aunt.   Review of Systems:  Review of Systems  Constitutional: Positive for chills, fever and malaise/fatigue.  HENT: Positive for congestion and sinus pain.   Respiratory: Positive for cough. Negative for shortness of breath and wheezing.   Cardiovascular: Negative for chest pain.  Gastrointestinal: Negative for abdominal pain and nausea.  Genitourinary: Negative for dysuria.  Musculoskeletal: Negative for myalgias.  Skin: Negative for rash.  Neurological: Negative for dizziness and headaches.  Psychiatric/Behavioral: The patient is not nervous/anxious.     Physical Exam:  Vital Signs: BP 137/82 (BP Location: Right Arm)   Pulse 91   Temp 98.4 F (36.9 C) (Oral)   Resp 18   LMP 11/04/2018 (Approximate)  Physical Exam  General: no acute distress.  HEENT: normocephalic, atraumatic Heart: regular rate & rhythm.  No murmurs/rubs/gallops Lungs: clear to auscultation  bilaterally, normal respiratory effort Abdomen: soft, gravid, non-tender;  EFW: 7 1/2 lbs  Pelvic:   External: Normal external female genitalia  Cervix: Dilation: 3 / Effacement (%): 80 / Station: 0    Extremities: non-tender, symmetric, No edema bilaterally.  DTRs: 2+/2+  Neurologic: Alert & oriented x 3.    No results found for this or any previous visit (from the past 24 hour(s)).  Pertinent Results:  Prenatal Labs: Blood  type/Rh O pos   Antibody screen neg  Rubella Immune  Varicella Immune  RPR NR  HBsAg Neg  HIV NR  GC neg  Chlamydia neg  Genetic screening negative  1 hour GTT 121  3 hour GTT Neg   GBS Neg    Tdap: given 07/04/2019 Flu: declined   FHT: 140 bpm, moderate variability, accels present, decels absent TOCO: mild contractions every 2-3 min  SVE:  Dilation: 3 / Effacement (%): 80 / Station: 0    Cephalic by leopolds and SVE   No results found.  Assessment:  Rita [name] is a 39 y.o. G44P0101 female at [redacted]w[redacted]d with early labor.   Plan:  1. Admit to Labor & Delivery; consents reviewed and obtained -Covid-19 positive, symptomatic -Inpatient consult to hospitalist for symptom management placed   2. Fetal Well being  - Fetal Tracing: cat 1  - Group B Streptococcus ppx not indicated: GBS neg  - Presentation: cephalic confirmed by SVE    3. Routine OB: - Prenatal labs reviewed, as above - Rh Pos - CBC, T&S, RPR on admit - Clear fluids, saline lock  4. Monitoring of Labor -  Contractions by toco - every 2-3 min -  Pelvis adequate for trial of labor  -  Expectant management of labor  -  Plan for continuous fetal monitoring x 4 hours, may change in intermittent if cervix unchanged -  Maternal pain control as desired - encourage early epidural if desired d/t obesity and covid-19 infection  - Anticipate vaginal delivery  5. Post Partum Planning: - Infant feeding: breast - Contraception: TBD  Gustavo Lah, CNM 09/02/19 7:38 PM  Margaretmary Eddy, CNM Certified Nurse Midwife [facility]

## 2019-09-03 ENCOUNTER — Encounter
Admission: EM | Disposition: A | Discharge status: Home / Self Care | Payer: Self-pay | Source: Home / Self Care | Attending: Obstetrics and Gynecology

## 2019-09-03 ENCOUNTER — Encounter: Payer: Self-pay | Admitting: Obstetrics and Gynecology

## 2019-09-03 ENCOUNTER — Observation Stay: Payer: Medicaid Other | Admitting: Registered Nurse

## 2019-09-03 DIAGNOSIS — Z3A39 39 weeks gestation of pregnancy: Secondary | ICD-10-CM | POA: Diagnosis not present

## 2019-09-03 DIAGNOSIS — S3141XA Laceration without foreign body of vagina and vulva, initial encounter: Secondary | ICD-10-CM | POA: Diagnosis not present

## 2019-09-03 DIAGNOSIS — O9852 Other viral diseases complicating childbirth: Secondary | ICD-10-CM | POA: Diagnosis not present

## 2019-09-03 DIAGNOSIS — O368130 Decreased fetal movements, third trimester, not applicable or unspecified: Secondary | ICD-10-CM | POA: Diagnosis not present

## 2019-09-03 DIAGNOSIS — D62 Acute posthemorrhagic anemia: Secondary | ICD-10-CM | POA: Diagnosis not present

## 2019-09-03 DIAGNOSIS — O9853 Other viral diseases complicating the puerperium: Secondary | ICD-10-CM | POA: Diagnosis present

## 2019-09-03 DIAGNOSIS — Z7982 Long term (current) use of aspirin: Secondary | ICD-10-CM | POA: Diagnosis not present

## 2019-09-03 DIAGNOSIS — O99214 Obesity complicating childbirth: Secondary | ICD-10-CM | POA: Diagnosis not present

## 2019-09-03 DIAGNOSIS — O9081 Anemia of the puerperium: Secondary | ICD-10-CM | POA: Diagnosis not present

## 2019-09-03 DIAGNOSIS — U071 COVID-19: Secondary | ICD-10-CM | POA: Diagnosis not present

## 2019-09-03 HISTORY — PX: REPAIR VAGINAL CUFF: SHX6067

## 2019-09-03 LAB — CBC WITH DIFFERENTIAL/PLATELET
Abs Immature Granulocytes: 0.02 10*3/uL (ref 0.00–0.07)
Basophils Absolute: 0 10*3/uL (ref 0.0–0.1)
Basophils Relative: 0 %
Eosinophils Absolute: 0 10*3/uL (ref 0.0–0.5)
Eosinophils Relative: 0 %
HCT: 37 % (ref 36.0–46.0)
Hemoglobin: 12 g/dL (ref 12.0–15.0)
Immature Granulocytes: 1 %
Lymphocytes Relative: 23 %
Lymphs Abs: 0.8 10*3/uL (ref 0.7–4.0)
MCH: 25.4 pg — ABNORMAL LOW (ref 26.0–34.0)
MCHC: 32.4 g/dL (ref 30.0–36.0)
MCV: 78.4 fL — ABNORMAL LOW (ref 80.0–100.0)
Monocytes Absolute: 0.4 10*3/uL (ref 0.1–1.0)
Monocytes Relative: 12 %
Neutro Abs: 2.2 10*3/uL (ref 1.7–7.7)
Neutrophils Relative %: 64 %
Platelets: 189 10*3/uL (ref 150–400)
RBC: 4.72 MIL/uL (ref 3.87–5.11)
RDW: 14.1 % (ref 11.5–15.5)
WBC: 3.5 10*3/uL — ABNORMAL LOW (ref 4.0–10.5)
nRBC: 0 % (ref 0.0–0.2)

## 2019-09-03 LAB — PROTIME-INR
INR: 0.9 (ref 0.8–1.2)
Prothrombin Time: 12 seconds (ref 11.4–15.2)

## 2019-09-03 LAB — CBC
HCT: 39.1 % (ref 36.0–46.0)
HCT: 26.8 % — ABNORMAL LOW (ref 36.0–46.0)
Hemoglobin: 12.3 g/dL (ref 12.0–15.0)
Hemoglobin: 9.2 g/dL — ABNORMAL LOW (ref 12.0–15.0)
MCH: 25.3 pg — ABNORMAL LOW (ref 26.0–34.0)
MCH: 26.5 pg (ref 26.0–34.0)
MCHC: 31.5 g/dL (ref 30.0–36.0)
MCHC: 34.3 g/dL (ref 30.0–36.0)
MCV: 80.5 fL (ref 80.0–100.0)
MCV: 77.2 fL — ABNORMAL LOW (ref 80.0–100.0)
Platelets: 125 10*3/uL — ABNORMAL LOW (ref 150–400)
Platelets: 147 10*3/uL — ABNORMAL LOW (ref 150–400)
RBC: 4.86 MIL/uL (ref 3.87–5.11)
RBC: 3.47 MIL/uL — ABNORMAL LOW (ref 3.87–5.11)
RDW: 14.1 % (ref 11.5–15.5)
RDW: 14.1 % (ref 11.5–15.5)
WBC: 9.9 10*3/uL (ref 4.0–10.5)
WBC: 9.2 10*3/uL (ref 4.0–10.5)
nRBC: 0 % (ref 0.0–0.2)
nRBC: 0 % (ref 0.0–0.2)

## 2019-09-03 LAB — COMPREHENSIVE METABOLIC PANEL
ALT: 19 U/L (ref 0–44)
ALT: 15 U/L (ref 0–44)
AST: 24 U/L (ref 15–41)
AST: 27 U/L (ref 15–41)
Albumin: 3 g/dL — ABNORMAL LOW (ref 3.5–5.0)
Albumin: 2.3 g/dL — ABNORMAL LOW (ref 3.5–5.0)
Alkaline Phosphatase: 105 U/L (ref 38–126)
Alkaline Phosphatase: 73 U/L (ref 38–126)
Anion gap: 10 (ref 5–15)
Anion gap: 7 (ref 5–15)
BUN: <5 mg/dL — ABNORMAL LOW (ref 6–20)
BUN: <5 mg/dL — ABNORMAL LOW (ref 6–20)
CO2: 22 mmol/L (ref 22–32)
CO2: 22 mmol/L (ref 22–32)
Calcium: 8.3 mg/dL — ABNORMAL LOW (ref 8.9–10.3)
Calcium: 7.2 mg/dL — ABNORMAL LOW (ref 8.9–10.3)
Chloride: 103 mmol/L (ref 98–111)
Chloride: 105 mmol/L (ref 98–111)
Creatinine, Ser: 0.41 mg/dL — ABNORMAL LOW (ref 0.44–1.00)
Creatinine, Ser: 0.36 mg/dL — ABNORMAL LOW (ref 0.44–1.00)
GFR calc Af Amer: >60 mL/min (ref >60)
GFR calc Af Amer: >60 mL/min (ref >60)
GFR calc non Af Amer: >60 mL/min (ref >60)
GFR calc non Af Amer: >60 mL/min (ref >60)
Glucose, Bld: 85 mg/dL (ref 70–99)
Glucose, Bld: 118 mg/dL — ABNORMAL HIGH (ref 70–99)
Potassium: 3.6 mmol/L (ref 3.5–5.1)
Potassium: 3.5 mmol/L (ref 3.5–5.1)
Sodium: 135 mmol/L (ref 135–145)
Sodium: 134 mmol/L — ABNORMAL LOW (ref 135–145)
Total Bilirubin: 0.6 mg/dL (ref 0.3–1.2)
Total Bilirubin: 0.5 mg/dL (ref 0.3–1.2)
Total Protein: 6.7 g/dL (ref 6.5–8.1)
Total Protein: 4.9 g/dL — ABNORMAL LOW (ref 6.5–8.1)

## 2019-09-03 LAB — RPR: RPR Ser Ql: NONREACTIVE

## 2019-09-03 LAB — TECHNOLOGIST SMEAR REVIEW: Plt Morphology: NORMAL

## 2019-09-03 LAB — APTT: aPTT: 29 seconds (ref 24–36)

## 2019-09-03 LAB — FIBRIN DERIVATIVES D-DIMER (ARMC ONLY): Fibrin derivatives D-dimer (ARMC): >7500 ng/mL (FEU) — ABNORMAL HIGH (ref 0.00–499.00)

## 2019-09-03 LAB — FIBRINOGEN: Fibrinogen: 452 mg/dL (ref 210–475)

## 2019-09-03 SURGERY — REPAIR, VAGINAL CUFF
Anesthesia: Choice

## 2019-09-03 MED ORDER — FENTANYL CITRATE (PF) 100 MCG/2ML IJ SOLN
INTRAMUSCULAR | Status: DC | PRN
  Administered 2019-09-03 (×2): 50 ug via INTRAVENOUS

## 2019-09-03 MED ORDER — LIDOCAINE HCL (PF) 1 % IJ SOLN
INTRAMUSCULAR | Status: DC | PRN
  Administered 2019-09-03: 3 mL via SUBCUTANEOUS

## 2019-09-03 MED ORDER — LIDOCAINE HCL (PF) 2 % IJ SOLN
INTRAMUSCULAR | Status: DC | PRN
  Administered 2019-09-03: 60 mg via EPIDURAL
  Administered 2019-09-03: 20 mg via EPIDURAL
  Administered 2019-09-03: 40 mg via EPIDURAL

## 2019-09-03 MED ORDER — SIMETHICONE 80 MG PO CHEW: 80.0000 mg | CHEWABLE_TABLET | ORAL | Status: DC | PRN

## 2019-09-03 MED ORDER — ONDANSETRON HCL 4 MG/2ML IJ SOLN: 4.0000 mg | INTRAMUSCULAR | Status: DC | PRN

## 2019-09-03 MED ORDER — OXYCODONE HCL 5 MG PO TABS
5.0000 mg | ORAL_TABLET | ORAL | Status: DC | PRN
  Administered 2019-09-04 (×2): 5 mg via ORAL
  Filled 2019-09-03: qty 1

## 2019-09-03 MED ORDER — PHENYLEPHRINE 40 MCG/ML (10ML) SYRINGE FOR IV PUSH (FOR BLOOD PRESSURE SUPPORT): 80.0000 ug | PREFILLED_SYRINGE | INTRAVENOUS | Status: DC | PRN

## 2019-09-03 MED ORDER — CEFAZOLIN SODIUM-DEXTROSE 1-4 GM/50ML-% IV SOLN
INTRAVENOUS | Status: DC | PRN
Start: 2019-09-03 — End: 2019-09-03
  Administered 2019-09-03: 3 g via INTRAVENOUS

## 2019-09-03 MED ORDER — EPHEDRINE 5 MG/ML INJ: 10.0000 mg | INTRAVENOUS | Status: DC | PRN

## 2019-09-03 MED ORDER — DIPHENHYDRAMINE HCL 50 MG/ML IJ SOLN: 12.5000 mg | INTRAMUSCULAR | Status: DC | PRN

## 2019-09-03 MED ORDER — MEASLES, MUMPS & RUBELLA VAC IJ SOLR
0.5000 mL | Freq: Once | INTRAMUSCULAR | Status: DC
  Filled 2019-09-03: qty 0.5

## 2019-09-03 MED ORDER — ONDANSETRON HCL 4 MG PO TABS
4.0000 mg | ORAL_TABLET | ORAL | Status: DC | PRN
  Administered 2019-09-04 (×2): 4 mg via ORAL
  Filled 2019-09-03 (×3): qty 1

## 2019-09-03 MED ORDER — LACTATED RINGERS IV SOLN
500.0000 mL | Freq: Once | INTRAVENOUS | Status: AC
  Administered 2019-09-03: 500 mL via INTRAVENOUS

## 2019-09-03 MED ORDER — SODIUM CHLORIDE 0.9 % IV SOLN
INTRAVENOUS | Status: DC | PRN
  Administered 2019-09-03: 5 mL via EPIDURAL
  Administered 2019-09-03: 8 mL via EPIDURAL

## 2019-09-03 MED ORDER — FENTANYL 2.5 MCG/ML W/ROPIVACAINE 0.15% IN NS 100 ML EPIDURAL (ARMC)
EPIDURAL | Status: AC
  Administered 2019-09-03: 12 mL/h via EPIDURAL
  Filled 2019-09-03: qty 100

## 2019-09-03 MED ORDER — TERBUTALINE SULFATE 1 MG/ML IJ SOLN: 0.2500 mg | Freq: Once | INTRAMUSCULAR | Status: DC | PRN

## 2019-09-03 MED ORDER — PROPOFOL 500 MG/50ML IV EMUL
INTRAVENOUS | Status: AC
  Filled 2019-09-03: qty 50

## 2019-09-03 MED ORDER — SENNOSIDES-DOCUSATE SODIUM 8.6-50 MG PO TABS
2.0000 | ORAL_TABLET | ORAL | Status: DC
  Administered 2019-09-04 (×2): 2 via ORAL
  Filled 2019-09-03 (×2): qty 2

## 2019-09-03 MED ORDER — SODIUM CHLORIDE 0.9 % IV SOLN: INTRAVENOUS | Status: DC | PRN

## 2019-09-03 MED ORDER — ACETAMINOPHEN 325 MG PO TABS
650.0000 mg | ORAL_TABLET | ORAL | Status: DC | PRN
  Administered 2019-09-04 – 2019-09-05 (×2): 650 mg via ORAL
  Filled 2019-09-03 (×2): qty 2

## 2019-09-03 MED ORDER — WITCH HAZEL-GLYCERIN EX PADS
1.0000 "application " | MEDICATED_PAD | CUTANEOUS | Status: DC | PRN
  Administered 2019-09-03: 1 via TOPICAL
  Filled 2019-09-03: qty 100

## 2019-09-03 MED ORDER — OXYTOCIN-SODIUM CHLORIDE 30-0.9 UT/500ML-% IV SOLN: 1.0000 m[IU]/min | INTRAVENOUS | Status: DC

## 2019-09-03 MED ORDER — SODIUM CHLORIDE 0.9% IV SOLUTION: Freq: Once | INTRAVENOUS | Status: DC

## 2019-09-03 MED ORDER — FERROUS SULFATE 325 (65 FE) MG PO TABS
325.0000 mg | ORAL_TABLET | Freq: Two times a day (BID) | ORAL | Status: DC
  Administered 2019-09-04 – 2019-09-05 (×3): 325 mg via ORAL
  Filled 2019-09-03 (×3): qty 1

## 2019-09-03 MED ORDER — OXYCODONE HCL 5 MG PO TABS
10.0000 mg | ORAL_TABLET | ORAL | Status: DC | PRN
  Administered 2019-09-03 – 2019-09-04 (×2): 10 mg via ORAL
  Filled 2019-09-03 (×3): qty 2

## 2019-09-03 MED ORDER — FENTANYL 2.5 MCG/ML W/ROPIVACAINE 0.15% IN NS 100 ML EPIDURAL (ARMC)
EPIDURAL | Status: AC
  Filled 2019-09-03: qty 100

## 2019-09-03 MED ORDER — FENTANYL 2.5 MCG/ML W/ROPIVACAINE 0.15% IN NS 100 ML EPIDURAL (ARMC)
12.0000 mL/h | EPIDURAL | Status: DC
  Administered 2019-09-03: 12 mL/h via EPIDURAL

## 2019-09-03 MED ORDER — MAGNESIUM HYDROXIDE 400 MG/5ML PO SUSP
30.0000 mL | ORAL | Status: DC | PRN
  Filled 2019-09-03: qty 30

## 2019-09-03 MED ORDER — BENZOCAINE-MENTHOL 20-0.5 % EX AERO
1.0000 "application " | INHALATION_SPRAY | CUTANEOUS | Status: DC | PRN
  Administered 2019-09-03: 1 via TOPICAL
  Filled 2019-09-03: qty 56

## 2019-09-03 MED ORDER — MIDAZOLAM HCL 2 MG/2ML IJ SOLN
INTRAMUSCULAR | Status: DC | PRN
  Administered 2019-09-03 (×2): 1 mg via INTRAVENOUS

## 2019-09-03 MED ORDER — DIPHENHYDRAMINE HCL 25 MG PO CAPS: 25.0000 mg | ORAL_CAPSULE | Freq: Four times a day (QID) | ORAL | Status: DC | PRN

## 2019-09-03 MED ORDER — DIBUCAINE (PERIANAL) 1 % EX OINT: 1.0000 "application " | TOPICAL_OINTMENT | CUTANEOUS | Status: DC | PRN

## 2019-09-03 MED ORDER — DEXTROSE 5 % IV SOLN
3.0000 g | Freq: Once | INTRAVENOUS | Status: AC
  Administered 2019-09-03: 3 g via INTRAVENOUS
  Filled 2019-09-03: qty 3

## 2019-09-03 MED ORDER — IBUPROFEN 600 MG PO TABS
600.0000 mg | ORAL_TABLET | Freq: Four times a day (QID) | ORAL | Status: DC
  Administered 2019-09-03 – 2019-09-05 (×7): 600 mg via ORAL
  Filled 2019-09-03 (×7): qty 1

## 2019-09-03 MED ORDER — OXYTOCIN-SODIUM CHLORIDE 30-0.9 UT/500ML-% IV SOLN
1.0000 m[IU]/min | INTRAVENOUS | Status: DC
  Administered 2019-09-03: 2 m[IU]/min via INTRAVENOUS

## 2019-09-03 MED ORDER — TRANEXAMIC ACID-NACL 1000-0.7 MG/100ML-% IV SOLN
1000.0000 mg | Freq: Once | INTRAVENOUS | Status: AC
  Administered 2019-09-03: 1000 mg via INTRAVENOUS
  Filled 2019-09-03: qty 100

## 2019-09-03 MED ORDER — COCONUT OIL OIL
1.0000 "application " | TOPICAL_OIL | Status: DC | PRN
  Administered 2019-09-04: 1 via TOPICAL
  Filled 2019-09-03: qty 120

## 2019-09-03 MED ORDER — PRENATAL MULTIVITAMIN CH
1.0000 | ORAL_TABLET | Freq: Every day | ORAL | Status: DC
  Administered 2019-09-04 – 2019-09-05 (×2): 1 via ORAL
  Filled 2019-09-03 (×2): qty 1

## 2019-09-03 MED ORDER — ZOLPIDEM TARTRATE 5 MG PO TABS: 5.0000 mg | ORAL_TABLET | Freq: Every evening | ORAL | Status: DC | PRN

## 2019-09-03 MED ORDER — SOD CITRATE-CITRIC ACID 500-334 MG/5ML PO SOLN
ORAL | Status: AC
  Filled 2019-09-03: qty 30

## 2019-09-03 MED ORDER — MISOPROSTOL 25 MCG QUARTER TABLET: 25.0000 ug | ORAL_TABLET | ORAL | Status: DC | PRN

## 2019-09-03 MED ORDER — SODIUM CHLORIDE 0.9 % IV SOLN
INTRAVENOUS | Status: DC | PRN
  Administered 2019-09-03: 50 ug/min via INTRAVENOUS

## 2019-09-03 SURGICAL SUPPLY — 39 items
BAG URINE DRAIN 2000ML AR STRL (UROLOGICAL SUPPLIES) ×6 IMPLANT
BLADE SURG SZ10 CARB STEEL (BLADE) ×3 IMPLANT
BNDG GAUZE 4.5X4.1 6PLY STRL (MISCELLANEOUS) ×3 IMPLANT
CATH FOLEY 2WAY  5CC 16FR (CATHETERS) ×4
CATH URTH 16FR FL 2W BLN LF (CATHETERS) ×2 IMPLANT
COVER WAND RF STERILE (DRAPES) ×3 IMPLANT
DRAPE 3/4 80X56 (DRAPES) ×3 IMPLANT
DRAPE PERI LITHO V/GYN (MISCELLANEOUS) ×3 IMPLANT
DRAPE SURG 17X11 SM STRL (DRAPES) ×3 IMPLANT
DRAPE UNDER BUTTOCK W/FLU (DRAPES) ×3 IMPLANT
ELECT REM PT RETURN 9FT ADLT (ELECTROSURGICAL) ×3
ELECTRODE REM PT RTRN 9FT ADLT (ELECTROSURGICAL) ×1 IMPLANT
GAUZE 4X4 16PLY RFD (DISPOSABLE) ×3 IMPLANT
GAUZE PACK 2X3YD (GAUZE/BANDAGES/DRESSINGS) IMPLANT
GLOVE SURG SYN 8.0 (GLOVE) ×3 IMPLANT
GOWN STRL REUS W/ TWL LRG LVL3 (GOWN DISPOSABLE) ×3 IMPLANT
GOWN STRL REUS W/ TWL XL LVL3 (GOWN DISPOSABLE) ×1 IMPLANT
GOWN STRL REUS W/TWL LRG LVL3 (GOWN DISPOSABLE) ×6
GOWN STRL REUS W/TWL XL LVL3 (GOWN DISPOSABLE) ×2
HANDLE YANKAUER SUCT BULB TIP (MISCELLANEOUS) ×3 IMPLANT
KIT TURNOVER CYSTO (KITS) ×3 IMPLANT
NDL SAFETY ECLIPSE 18X1.5 (NEEDLE) ×1 IMPLANT
NEEDLE HYPO 18GX1.5 SHARP (NEEDLE) ×2
NEEDLE HYPO 22GX1.5 SAFETY (NEEDLE) ×3 IMPLANT
NS IRRIG 500ML POUR BTL (IV SOLUTION) ×3 IMPLANT
PACK BASIN MINOR (MISCELLANEOUS) ×3 IMPLANT
PAD OB MATERNITY 4.3X12.25 (PERSONAL CARE ITEMS) ×3 IMPLANT
PAD PREP 24X41 OB/GYN DISP (PERSONAL CARE ITEMS) ×3 IMPLANT
PENCIL ELECTRO HAND CTR (MISCELLANEOUS) ×3 IMPLANT
SPONGE LAP 18X18 RF (DISPOSABLE) ×3 IMPLANT
SUT VIC AB 0 CT1 27 (SUTURE) ×2
SUT VIC AB 0 CT1 27XCR 8 STRN (SUTURE) ×1 IMPLANT
SUT VIC AB 0 CT1 36 (SUTURE) ×3 IMPLANT
SUT VIC AB 2-0 CT1 (SUTURE) ×3 IMPLANT
SUT VIC AB 3-0 SH 27 (SUTURE) ×2
SUT VIC AB 3-0 SH 27X BRD (SUTURE) ×1 IMPLANT
SYR 10ML LL (SYRINGE) ×3 IMPLANT
SYR 30ML LL (SYRINGE) ×3 IMPLANT
SYR CONTROL 10ML LL (SYRINGE) ×3 IMPLANT

## 2019-09-03 NOTE — Transfer of Care (Signed)
Immediate Anesthesia Transfer of Care Note  Patient: Rita [name]  Procedure(s) Performed: REPAIR VAGINAL CUFF (N/A )  Patient Location: PACU and Mother/Baby  Anesthesia Type:Epidural  Level of Consciousness: awake  Airway & Oxygen Therapy: Patient Spontanous Breathing  Post-op Assessment: Report given to RN  Post vital signs: stable  Last Vitals:  Vitals Value Taken Time  BP    Temp    Pulse    Resp    SpO2      Last Pain:  Vitals:   09/03/19 1430  TempSrc: Oral  PainSc:          Complications: No complications documented.

## 2019-09-03 NOTE — Anesthesia Postprocedure Evaluation (Signed)
Anesthesia Post Note  Patient: ESSANCE GATTI  Procedure(s) Performed: REPAIR VAGINAL CUFF (N/A )  Patient location during evaluation: PACU Anesthesia Type: General Level of consciousness: awake and alert Pain management: pain level controlled Vital Signs Assessment: post-procedure vital signs reviewed and stable Respiratory status: spontaneous breathing, nonlabored ventilation and respiratory function stable Cardiovascular status: blood pressure returned to baseline and stable Postop Assessment: no apparent nausea or vomiting Anesthetic complications: no   No complications documented.   Last Vitals:  Vitals:   09/03/19 2115 09/03/19 2117  BP:  (!) 111/57  Pulse:  96  Resp:  16  Temp:  37.8 C  SpO2: 99%     Last Pain:  Vitals:   09/03/19 2117  TempSrc: Axillary  PainSc:                  Karleen Hampshire

## 2019-09-03 NOTE — Anesthesia Procedure Notes (Signed)
Epidural Patient location during procedure: OB Start time: 09/03/2019 8:42 AM End time: 09/03/2019 9:02 AM  Staffing Anesthesiologist: Lenard Simmer, MD Resident/CRNA: Lynden Oxford, CRNA Performed: resident/CRNA   Preanesthetic Checklist Completed: patient identified, IV checked, site marked, risks and benefits discussed, monitors and equipment checked and pre-op evaluation  Epidural Patient position: sitting Prep: ChloraPrep Patient monitoring: heart rate, cardiac monitor, continuous pulse ox and blood pressure Approach: midline Location: L3-L4 Injection technique: LOR air  Needle:  Needle type: Tuohy  Needle gauge: 17 G Needle length: 9 cm Needle insertion depth: 8 cm Catheter size: 19 Gauge Catheter at skin depth: 17 cm Test dose: Other and negative (0.5% bupivicaine)  Additional Notes Reason for block:procedure for pain

## 2019-09-03 NOTE — Progress Notes (Signed)
Labor Progress Note  Rita [name] is a 39 y.o. G2P0101 at [redacted]w[redacted]d by LMP admitted for early labor and decreased fetal movement at term.   Subjective: Feeling more period-like cramping   Objective: BP 128/80 (BP Location: Right Arm)   Pulse (!) 110   Temp 98.3 F (36.8 C) (Oral)   Resp 18   Ht 5\' 6"  (1.676 m)   Wt (!) 142.9 kg   LMP 11/04/2018 (Approximate)   SpO2 100%   BMI 50.84 kg/m  Notable VS details: reviewed   Fetal Assessment: FHT:  FHR: 155 bpm, variability: moderate,  accelerations:  Present,  decelerations:  Absent Category/reactivity:  Category I UC:   regular, every 2-5 minutes SVE:   3-4/90/0 per RN Membrane status: Bulging  Amniotic color: N/A  Labs: Lab Results  Component Value Date   WBC 3.5 (L) 09/03/2019   HGB 12.0 09/03/2019   HCT 37.0 09/03/2019   MCV 78.4 (L) 09/03/2019   PLT 189 09/03/2019    Assessment / Plan: Early labor, will start augmentation with oxytocin.  Dr. 11/03/2019 will manage labor.  Hospitalist consulted for management of covid-19 infection.  No interventions needed at this time, they will help follow her during her admission.    Labor: Progressing normally Preeclampsia:  no s/s Fetal Wellbeing:  Category I Pain Control:  Labor support without medications.  Will discuss with anesthesia on timing of epidural placement.  Consider early epidural d/t covid-19 infection and BMI.   I/D:  afebrile    Feliberto Gottron, CNM 09/03/2019, 8:09 AM

## 2019-09-03 NOTE — Anesthesia Preprocedure Evaluation (Addendum)
Anesthesia Evaluation  Patient identified by MRN, date of birth, ID band Patient awake    Reviewed: Allergy & Precautions, H&P , NPO status , Patient's Chart, lab work & pertinent test results  Airway Mallampati: II       Dental no notable dental hx.    Pulmonary Recent URI  (COVID 19 positive), Residual Cough,           Cardiovascular negative cardio ROS Normal cardiovascular exam     Neuro/Psych negative neurological ROS  negative psych ROS   GI/Hepatic GERD  ,  Endo/Other    Renal/GU   negative genitourinary   Musculoskeletal   Abdominal   Peds negative pediatric ROS (+)  Hematology negative hematology ROS (+)   Anesthesia Other Findings   Reproductive/Obstetrics (+) Pregnancy                             Anesthesia Physical Anesthesia Plan  ASA: III  Anesthesia Plan:    Post-op Pain Management:    Induction:   PONV Risk Score and Plan:   Airway Management Planned:   Additional Equipment:   Intra-op Plan:   Post-operative Plan:   Informed Consent:   Plan Discussed with:   Anesthesia Plan Comments:         Anesthesia Quick Evaluation

## 2019-09-03 NOTE — Progress Notes (Signed)
Rita [name] is a 39 y.o. G2P0101 at [redacted]w[redacted]d + COVID  Subjective:  No SOB   painful CTX required CLE   Objective: BP 130/75   Pulse (!) 103   Temp 98.3 F (36.8 C) (Oral)   Resp 18   Ht 5\' 6"  (1.676 m)   Wt (!) 142.9 kg   LMP 11/04/2018 (Approximate)   SpO2 100%   BMI 50.84 kg/m  No intake/output data recorded. No intake/output data recorded.  FHT:  FHR: 150 BPM  bpm, variability: minimal ,  accelerations:  Present,  decelerations:  Present variable  UC:   irregular, every 3 minutes SVE:   Dilation: 4 Effacement (%): 100 Station: 0 Exam by:: Dhanvin Szeto MD [facility] clear IUPC placed  Labs: Lab Results  Component Value Date   WBC 3.5 (L) 09/03/2019   HGB 12.0 09/03/2019   HCT 37.0 09/03/2019   MCV 78.4 (L) 09/03/2019   PLT 189 09/03/2019    Assessment / Plan: Spontaneous labor, progressing normally Asymptomatic COVID  Continue Pitocin augmentation   11/03/2019 Rita [name] 09/03/2019, 11:30 AM

## 2019-09-03 NOTE — Progress Notes (Signed)
Pt. transported to OR for repair. Fundal checks performed by MD during repair; Md states that the uterus was "firm" throughout repair. Vaginal packing by MD. Pt. alert and oriented at time of transport. Last BP 104/52, pulse 117. Pt. States no pain at the time of transport.

## 2019-09-03 NOTE — OR Nursing (Signed)
Two lap tapes were removed from pt vagina before prepping in the Operating room.

## 2019-09-03 NOTE — Discharge Summary (Signed)
Obstetrical Discharge Summary  Patient Name: Rita [name] DOB: Mar 04, 1980 MRN: [id number]  Date of Admission: 09/02/2019 Date of Delivery: 09/03/19 Delivered by: Rita Gust MD Date of Discharge: 09/05/2019 Primary [facility]: [name]  [id number] last menstrual period was 11/04/2018 (approximate). EDC Estimated Date of Delivery: 09/05/19 Gestational Age at Delivery: [redacted]w[redacted]d   Antepartum complications: incompetent cervix, cerclage placement  Admitting Diagnosis: decreased fetal movt , + Covid on admission   Secondary Diagnosis: Patient Active Problem List   Diagnosis Date Noted   PPH (postpartum hemorrhage) 09/03/2019   COVID-19 affecting puerperium 09/03/2019   Indication for care in labor or delivery 09/02/2019   COVID-19 virus infection 09/02/2019   Decreased fetal movement 08/29/2019   Labor and delivery, indication for care 06/16/2019   History of incompetent cervix, currently pregnant 03/27/2019   High-risk pregnancy supervision, third trimester 02/08/2019   History of cervical incompetence 02/08/2019   Back pain 04/20/2015   Pain of right scapula 04/20/2015   Obesity (BMI 35.0-39.9 without comorbidity) 04/20/2015   Preventative health care 04/20/2015    Augmentation: AROM, Pitocin and Cytotec Complications: PPH QBL 2890cc . Taken to Or to repair . Repeat hct  Stable , but she did receive 1unit of blood and 1 unit of PRBC . POST op HCT 9.2->7.7->2 units PRBC->8.9 Intrapartum complications/course: deep transverse arrest . Kielland rotation and delivery. Significant continued vaginal oozing .  Date of Delivery: 09/03/2019 Delivered By: Rita Gust MD Delivery Type: forceps, low and rotation  Anesthesia: epidural Placenta: spontaneous Laceration: right sulcus , and posterior central  Episiotomy: none Newborn Data: Live born female  Birth Weight:  #8/3 APGAR: 8, 9  Newborn Delivery   Birth date/time: 09/03/2019 14:57:00 Delivery type: Vaginal,  Forceps      Postpartum Procedures: Laceration repair in OR, 3 units PRBC for PPH  Edinburgh:  Edinburgh Postnatal Depression Scale Screening Tool 09/04/2019  I have been able to laugh and see the funny side of things. 0  I have looked forward with enjoyment to things. 0  I have blamed myself unnecessarily when things went wrong. 0  I have been anxious or worried for no good reason. 0  I have felt scared or panicky for no good reason. 3  Things have been getting on top of me. 0  I have been so unhappy that I have had difficulty sleeping. 0  I have felt sad or miserable. 0  I have been so unhappy that I have been crying. 0  The thought of harming myself has occurred to me. 0  Edinburgh Postnatal Depression Scale Total 3    Post partum course:   Taken to the OR for persistent vaginal oozing  1 unit blood and 1 unit FFP in OR . Daily repeat serial hgb.   On postpartum day 1 hgb was 7.7, given 2 units PRBC.  Hgb increased to 8.9 on postapartum day 2. By time of discharge on PPD#2, her pain was controlled on oral pain medications; she had appropriate lochia and was ambulating, voiding without difficulty and tolerating regular diet.  Mild covid symptoms present.  Responsive to supportive therapy. TOC consult prior to discharge to determine eligibility for outpatient monoclonal antibody therapy. She was deemed stable for discharge to home.    Discharge Physical Exam:  BP (!) 104/59 (BP Location: Left Arm)    Pulse 85    Temp 98 F (36.7 C) (Oral)    Resp 18    Ht 5\' 6"  (1.676 m)    Wt )  142.9 kg    LMP 11/04/2018 (Approximate)    SpO2 97%    Breastfeeding Unknown    BMI 50.84 kg/m   General: NAD CV: RRR Pulm: CTABL, nl effort ABD: s/nd/nt, fundus firm and below the umbilicus Lochia: moderate Incision: c/d/i DVT Evaluation: LE non-ttp, no evidence of DVT on exam.  Hemoglobin  Date Value Ref Range Status  09/05/2019 8.9 (L) 12.0 - 15.0 g/dL Final   HCT  Date Value Ref Range Status   09/05/2019 26.9 (L) 36 - 46 % Final     Disposition: stable, discharge to home. Baby Feeding: breastmilk and formula Baby Disposition: home with mom  Rh Immune globulin given: Rh pos  Rubella vaccine given: Immune  Tdap vaccine given in AP or PP setting: Given 07/04/2019 Flu vaccine given in AP or PP setting: declined   Contraception: Mirena IUD   Prenatal Labs:  Blood type/Rh O pos   Antibody screen neg  Rubella Immune  Varicella Immune  RPR NR  HBsAg Neg  HIV NR  GC neg  Chlamydia neg  Genetic screening negative  1 hour GTT 121  3 hour GTT Neg   GBS Neg       Plan:  Rita [name] was discharged to home in good condition. Follow-up appointment with delivering provider in 2 weeks for evaluation of vaginal laceration.  Discharge Medications: Allergies as of 09/05/2019      Reactions   Metronidazole Other (See Comments), Rash   Lips swelling Lips swelling      Medication List    TAKE these medications   acetaminophen 325 MG tablet Commonly known as: TYLENOL Take 650 mg by mouth every 6 (six) hours as needed (pain.).   enoxaparin 40 MG/0.4ML injection Commonly known as: LOVENOX Inject 0.4 mLs (40 mg total) into the skin every 12 (twelve) hours for 8 days.   ferrous sulfate 325 (65 FE) MG tablet Take 1 tablet (325 mg total) by mouth 2 (two) times daily with a meal.   Melatonin 3 MG Caps Take 2 capsules (6 mg total) by mouth at bedtime.   prenatal vitamin w/FE, FA 27-1 MG Tabs tablet Take 1 tablet by mouth daily at 12 noon.   senna-docusate 8.6-50 MG tablet Commonly known as: Senokot-S Take 2 tablets by mouth daily. Start taking on: September 06, 2019   vitamin C 250 MG tablet Commonly known as: ASCORBIC ACID Take 2 tablets (500 mg total) by mouth daily.   Zinc Sulfate 220 (50 Zn) MG Tabs Take 1 tablet (220 mg total) by mouth daily.        Follow-up Information    [name], Rita Austin, MD. Schedule an appointment as soon as possible for a  visit in 2 week(s).   Specialty: Obstetrics and Gynecology Why: Postpartum evaluation of vaginal laceration  Contact information: [address] [place] [address] [phone]               Signed:  Margaretmary [name], CNM Certified Nurse Midwife [employer]  [facility]

## 2019-09-03 NOTE — Progress Notes (Signed)
Patient ID: Rita [name], female   DOB: 20-Jun-1980, 39 y.o.   MRN: [id number] S/p Kielland forcep delivery with right sulcus laceration and central vaginal laceration . Persistent Vaginal bleeding that could not be controlled with multiple sutures . QBL 2895 TXA 1000 mg is being administered . I will take the patient to the OR for repair . Stat CBc ordered. T+S has been done  Massive hemorrhage protocol called for  Ordered 2 units of blood

## 2019-09-03 NOTE — Progress Notes (Signed)
Pt. returned to L/D for recovery post-op.Unit of blood was finished per RN upon arrival to unit.  Vaginal packing present, no blood noted on pad. Her fundus was firm and midline upon assessment, VSS. Pt. is alert and oriented.

## 2019-09-03 NOTE — OR Nursing (Signed)
Pt came to the operating room with one set of clear crystal stub earrings, a grey colored chain necklace, 2 grey colored rings one with a large clear center stone and multiple small clear stones on either side, as well as a vertical clitoral hood piercing grey colored in appearance.Rings and earrings were covered with paper tape. Due to the emergency status of the case none of these items were removed and surgeon is aware.

## 2019-09-04 ENCOUNTER — Encounter: Payer: Self-pay | Admitting: Obstetrics and Gynecology

## 2019-09-04 LAB — PREPARE FRESH FROZEN PLASMA
Unit division: 0
Unit division: 0

## 2019-09-04 LAB — CBC
HCT: 23.7 % — ABNORMAL LOW (ref 36.0–46.0)
Hemoglobin: 7.7 g/dL — ABNORMAL LOW (ref 12.0–15.0)
MCH: 26.1 pg (ref 26.0–34.0)
MCHC: 32.5 g/dL (ref 30.0–36.0)
MCV: 80.3 fL (ref 80.0–100.0)
Platelets: 115 10*3/uL — ABNORMAL LOW (ref 150–400)
RBC: 2.95 MIL/uL — ABNORMAL LOW (ref 3.87–5.11)
RDW: 14.1 % (ref 11.5–15.5)
WBC: 6.8 10*3/uL (ref 4.0–10.5)
nRBC: 0 % (ref 0.0–0.2)

## 2019-09-04 LAB — PREPARE RBC (CROSSMATCH)

## 2019-09-04 LAB — BPAM FFP
Blood Product Expiration Date: 202108082359
Blood Product Expiration Date: 202108082359
Blood Product Expiration Date: 202108082359
Blood Product Expiration Date: 202108082359
ISSUE DATE / TIME: 202108031739
ISSUE DATE / TIME: 202108031739
ISSUE DATE / TIME: 202108031739
ISSUE DATE / TIME: 202108031739
Unit Type and Rh: 5100
Unit Type and Rh: 5100
Unit Type and Rh: 9500
Unit Type and Rh: 9500

## 2019-09-04 LAB — MASSIVE TRANSFUSION PROTOCOL ORDER (BLOOD BANK NOTIFICATION)

## 2019-09-04 MED ORDER — ENOXAPARIN SODIUM 40 MG/0.4ML ~~LOC~~ SOLN
40.0000 mg | Freq: Two times a day (BID) | SUBCUTANEOUS | Status: DC
  Administered 2019-09-05: 40 mg via SUBCUTANEOUS
  Filled 2019-09-04: qty 0.4

## 2019-09-04 MED ORDER — ENOXAPARIN SODIUM 40 MG/0.4ML ~~LOC~~ SOLN: 40.0000 mg | Freq: Two times a day (BID) | SUBCUTANEOUS | Status: DC

## 2019-09-04 MED ORDER — SODIUM CHLORIDE 0.9% IV SOLUTION: Freq: Once | INTRAVENOUS | Status: AC

## 2019-09-04 MED ORDER — MEASLES, MUMPS & RUBELLA VAC IJ SOLR
0.5000 mL | Freq: Once | INTRAMUSCULAR | Status: DC
  Filled 2019-09-04: qty 0.5

## 2019-09-04 NOTE — Progress Notes (Signed)
Post Partum Day 1  Subjective: Doing well, no concerns. Ambulating without difficulty, pain managed with PO meds, tolerating regular diet, and voiding without difficulty.   No fever/chills, chest pain, shortness of breath, nausea/vomiting, or leg pain. No nipple or breast pain.  Objective: BP 90/68 (BP Location: Right Arm)   Pulse 77   Temp 97.8 F (36.6 C) (Oral)   Resp 18   Ht 5\' 6"  (1.676 m)   Wt (!) 142.9 kg   LMP 11/04/2018 (Approximate)   SpO2 99%   Breastfeeding Unknown   BMI 50.84 kg/m    Physical Exam:  General: alert, cooperative, appears stated age and fatigued Breasts: soft/nontender CV: RRR Pulm: nl effort, CTABL Abdomen: soft, non-tender, active bowel sounds Uterine Fundus: firm Lochia: appropriate, vaginal packing removed DVT Evaluation: No evidence of DVT seen on physical exam. No cords or calf tenderness. No significant calf/ankle edema.  Recent Labs    09/03/19 1658 09/03/19 2158  HGB 12.3 9.2*  HCT 39.1 26.8*  WBC 9.9 9.2  PLT 125* 147*    Assessment/Plan: 39 y.o. G2P1102 postpartum day # 1  -Continue routine postpartum care -Lactation consult PRN for breastfeeding  -Acute blood loss anemia d/t PPH: continue PO ferrous sulfate BID with stool softeners. AM CBC pending, will follow result to determine need for further intervention.  -Immunization status: all immunizations up to date -COVID-19: Oxygen saturation 96-100% on RA  Disposition: Continue inpatient postpartum care    LOS: 1 day   30, CNM 09/04/2019, 8:44 AM   ----- 11/04/2019 Certified Nurse Midwife [facility]

## 2019-09-04 NOTE — Op Note (Signed)
NAME: Rita [name], Rita [name] MEDICAL RECORD [id number] ACCOUNT [id number] DATE OF BIRTH:1980-02-10 FACILITY: [facility] LOCATION: [facility] PHYSICIAN:Ivin Rosenbloom Cloyde Reams, MD  OPERATIVE REPORT  DATE OF PROCEDURE:  09/03/2019  PREOPERATIVE DIAGNOSES: 1.  39+5 weeks estimated gestational age. 2.  Deep fetal transverse arrest. 3.  Postpartum hemorrhage. 4.  COVID positive.  POSTOPERATIVE DIAGNOSES:   1.  39+5 weeks estimated gestational age.   2.  Vigorous female delivered via Kielland forceps, low.   3.  Significant vaginal lacerations requiring repair in the operating room. 4.  Postpartum hemorrhage.   5.  COVID positive.  PROCEDURE: 1.  Low Kielland forceps delivery with 90-degree rotation. 2.  Extensive repair of vaginal lacerations.  Total length of laceration, 9 cm. 3.  Vaginal packing. 4.  Blood transfusion and fresh frozen plasma transfusion.  SURGEON:  Jennell Corner, MD   ANESTHESIA:  Epidural.  FIRST ASSISTANT:  Haroldine Laws, certified nurse midwife  INDICATIONS:  A 39 year old gravida 2, para 0-1-0-1 patient admitted to labor and delivery for decreased fetal movement and was tested positive for COVID.  The patient's labor was induced and the patient progressed adequately.  The patient pushed for  approximately 1.5 hours and did not bring the fetal head down more than +3/3 station.  Examination by me revealed the fetus to be in the left occiput transverse position.  This was verified by bedside ultrasound.  After speaking with the patient  regarding the risk of a forceps delivery, patient agreed.  The patient's bladder had been previously drained and the Kielland forceps were brought up to the operative field.  The anterior Killian blade was placed in the classical maneuver and the  posterior blade was placed without difficulty.  The blades articulated well without excessive traction.  The fetal head was then rotated in a counterclockwise fashion for 90 degrees to  bring the occiput anterior.  With the next set of pushes, the fetal  head was delivered.  Two pushes and pulls with the forceps were required to deliver the head.  Forceps were removed and 2 loose nuchal cords were reduced followed by delivery of the infant's shoulder and body.  Vigorous female was then placed on the  mother's abdomen for drying and nursery staff was in attendance.  After 60 seconds, cord was doubly clamped and cut.  Of note, there was a true knot in the proximal portion of the umbilical cord approximately 10 cm from the insertion of the fetal  umbilicus.  The placenta was then delivered and intravenous Pitocin was administered.  There were several lacerations noted, most notably a right sulcus tear and a central laceration.  2-0 Vicryl suture was used to close these tissues.  Total laceration  length 9 cm.  The patient continued to have significant oozing despite multiple attempts with figure-of-eight and running sutures.  Given the persistent oozing the vagina was then packed and the patient was taken to the operating room where the patient  was placed in [place] stirrups and better visualization could be obtained.  Additional sutures were placed with good hemostasis and only a small amount of oozing was noted.  The perineal body was brought together with a crown sutures and a subcuticular 3-0  Vicryl suture was used to close the skin.  Given still a small ooze from the vagina the vagina was packed with a Kerlix roll to aid in giving additional pressure.  The patient did receive 1 unit of blood and 1 unit of fresh frozen plasma given the  concern  that she had lost 2900 mL that was measured in the labor and delivery room.  Repeat hematocrit during the operation showed hematocrit to be stable.  The patient remained stable throughout the procedure.  Foley catheter was placed into the bladder  and yielded concentrated urine approximately 100 mL total.  There were no  complications.  INTRAOPERATIVE FLUIDS:  1300 mL.  ESTIMATED BLOOD LOSS:  75 mL.    BLOOD PRODUCTS:  One unit of packed red blood cells, 1 unit of FFP.  URINE OUTPUT:  100 mL.    Of note, the patient did receive 1 gram of tranexamic acid prior to entering the operating room.  COMPLICATIONS:  There were no complications.  DISPOSITION:  The patient tolerated the procedure well and was taken to recovery room in good condition.  CN/NUANCE  D:09/03/2019 T:09/04/2019 JOB:[id number]

## 2019-09-04 NOTE — Consult Note (Signed)
ANTICOAGULATION CONSULT NOTE - Initial Consult  Pharmacy Consult for Enoxaparin Indication: VTE prophylaxis in COVID-19 disease  Allergies  Allergen Reactions  . Metronidazole Other (See Comments) and Rash    Lips swelling Lips swelling    Patient Measurements: Height: 5\' 6"  (167.6 cm) Weight: (!) 142.9 kg (315 lb) IBW/kg (Calculated) : 59.3  Vital Signs: Temp: 97.9 F (36.6 C) (08/04 1138) Temp Source: Oral (08/04 1138) BP: 120/78 (08/04 1138) Pulse Rate: 74 (08/04 1138)  Labs: Recent Labs    09/03/19 0526 09/03/19 0526 09/03/19 1658 09/03/19 1658 09/03/19 2158 09/04/19 0710  HGB 12.0   < > 12.3   < > 9.2* 7.7*  HCT 37.0   < > 39.1  --  26.8* 23.7*  PLT 189   < > 125*  --  147* 115*  APTT  --   --  29  --   --   --   LABPROT  --   --  12.0  --   --   --   INR  --   --  0.9  --   --   --   CREATININE 0.41*  --   --   --  0.36*  --    < > = values in this interval not displayed.    Estimated Creatinine Clearance: 140.9 mL/min (A) (by C-G formula based on SCr of 0.36 mg/dL (L)).  Assessment: Patient is a 39 y/o F with history of morbid obesity admitted to L&D for early labor and decreased fetal movement at term and delivered baby on 09/03/2019. Patient tested positive for COVID-19 on 08/30/2019. Labs significant for D-dimer >7500. Pharmacy has been consulted for enoxaparin dosing for VTE prophylaxis in setting of COVID-19 disease.   Plan:  --Enoxaparin 40 mg SQ q12h based on BMI > 35 and CrCl > 30 mL/min --CBC per protocol. Hemoglobin and platelets trending down s/p delivery. Patient has been ordered blood transfusion. Continue to monitor.   09/01/2019 09/04/2019,3:44 PM

## 2019-09-04 NOTE — Progress Notes (Signed)
Vaginal Packing removed by CNM Hav[place]and. Pt tolerated procedure and bleeding is scant.

## 2019-09-04 NOTE — Progress Notes (Signed)
Pt Foley D/C per provider. Patient ambulated to restroom with SpO2 remaining 97-100%on room air so Provider okay with D/C from continuous pulse ox. Pt states she feels much better after getting up to the bathroom and moving around out of the bed.

## 2019-09-05 LAB — BPAM RBC
Blood Product Expiration Date: 202108272359
Blood Product Expiration Date: 202109012359
Blood Product Expiration Date: 202109012359
Blood Product Expiration Date: 202109012359
Blood Product Expiration Date: 202109012359
Blood Product Expiration Date: 202109012359
Blood Product Expiration Date: 202109022359
Blood Product Expiration Date: 202109022359
ISSUE DATE / TIME: 202108031739
ISSUE DATE / TIME: 202108040926
ISSUE DATE / TIME: 202108041144
ISSUE DATE / TIME: 202108041334
ISSUE DATE / TIME: 202108041622
ISSUE DATE / TIME: 202108042233
ISSUE DATE / TIME: 202108050215
ISSUE DATE / TIME: 202108050621
Unit Type and Rh: 5100
Unit Type and Rh: 5100
Unit Type and Rh: 5100
Unit Type and Rh: 5100
Unit Type and Rh: 5100
Unit Type and Rh: 5100
Unit Type and Rh: 5100
Unit Type and Rh: 5100

## 2019-09-05 LAB — TYPE AND SCREEN
ABO/RH(D): O POS
Antibody Screen: NEGATIVE
Unit division: 0
Unit division: 0
Unit division: 0
Unit division: 0
Unit division: 0
Unit division: 0
Unit division: 0
Unit division: 0

## 2019-09-05 LAB — CBC
HCT: 26.9 % — ABNORMAL LOW (ref 36.0–46.0)
Hemoglobin: 8.9 g/dL — ABNORMAL LOW (ref 12.0–15.0)
MCH: 26.4 pg (ref 26.0–34.0)
MCHC: 33.1 g/dL (ref 30.0–36.0)
MCV: 79.8 fL — ABNORMAL LOW (ref 80.0–100.0)
Platelets: 126 10*3/uL — ABNORMAL LOW (ref 150–400)
RBC: 3.37 MIL/uL — ABNORMAL LOW (ref 3.87–5.11)
RDW: 14.7 % (ref 11.5–15.5)
WBC: 9.1 10*3/uL (ref 4.0–10.5)
nRBC: 0 % (ref 0.0–0.2)

## 2019-09-05 LAB — SURGICAL PATHOLOGY

## 2019-09-05 MED ORDER — ENOXAPARIN SODIUM 40 MG/0.4ML ~~LOC~~ SOLN: 40.0000 mg | Freq: Two times a day (BID) | SUBCUTANEOUS | 0 refills | Status: DC

## 2019-09-05 MED ORDER — VITAMIN C 250 MG PO TABS
500.0000 mg | ORAL_TABLET | Freq: Every day | ORAL | Status: DC
Start: 2019-09-05 — End: 2022-04-20

## 2019-09-05 MED ORDER — ZINC SULFATE 220 (50 ZN) MG PO TABS: 220.0000 mg | ORAL_TABLET | Freq: Every day | ORAL | 0 refills | Status: DC

## 2019-09-05 MED ORDER — SENNOSIDES-DOCUSATE SODIUM 8.6-50 MG PO TABS: 2.0000 | ORAL_TABLET | ORAL | Status: DC

## 2019-09-05 MED ORDER — MELATONIN 3 MG PO CAPS: 6.0000 mg | ORAL_CAPSULE | Freq: Every evening | ORAL | 0 refills | Status: DC

## 2019-09-05 MED ORDER — DM-GUAIFENESIN ER 30-600 MG PO TB12
1.0000 | ORAL_TABLET | Freq: Two times a day (BID) | ORAL | Status: DC
  Administered 2019-09-05: 1 via ORAL
  Filled 2019-09-05 (×2): qty 1

## 2019-09-05 MED ORDER — FERROUS SULFATE 325 (65 FE) MG PO TABS: 325.0000 mg | ORAL_TABLET | Freq: Two times a day (BID) | ORAL | 3 refills | Status: DC

## 2019-09-05 NOTE — Progress Notes (Signed)
Pt discharged with infant.  Discharge instructions, prescriptions and follow up appointment given to and reviewed with pt. Pt verbalized understanding. Escorted out by staff. 

## 2019-09-05 NOTE — Discharge Instructions (Signed)
Follow up with PCP to discuss monoclonal antibody therapy.      Vaginal Delivery, Care After Refer to this sheet in the next few weeks. These discharge instructions provide you with information on caring for yourself after delivery. Your caregiver may also give you specific instructions. Your treatment has been planned according to the most current medical practices available, but problems sometimes occur. Call your caregiver if you have any problems or questions after you go home. HOME CARE INSTRUCTIONS 1. Take over-the-counter or prescription medicines only as directed by your caregiver or pharmacist. 2. Do not drink alcohol, especially if you are breastfeeding or taking medicine to relieve pain. 3. Do not smoke tobacco. 4. Continue to use good perineal care. Good perineal care includes: 1. Wiping your perineum from back to front 2. Keeping your perineum clean. 3. You can do sitz baths twice a day, to help keep this area clean 5. Do not use tampons, douche or have sex until your caregiver says it is okay. 6. Shower only and avoid sitting in submerged water, aside from sitz baths 7. Wear a well-fitting bra that provides breast support. 8. Eat healthy foods. 9. Drink enough fluids to keep your urine clear or pale yellow. 10. Eat high-fiber foods such as whole grain cereals and breads, brown rice, beans, and fresh fruits and vegetables every day. These foods may help prevent or relieve constipation. 11. Avoid constipation with high fiber foods or medications, such as miralax or metamucil 12. Follow your caregiver's recommendations regarding resumption of activities such as climbing stairs, driving, lifting, exercising, or traveling. 13. Talk to your caregiver about resuming sexual activities. Resumption of sexual activities is dependent upon your risk of infection, your rate of healing, and your comfort and desire to resume sexual activity. 14. Try to have someone help you with your household  activities and your newborn for at least a few days after you leave the hospital. 15. Rest as much as possible. Try to rest or take a nap when your newborn is sleeping. 16. Increase your activities gradually. 17. Keep all of your scheduled postpartum appointments. It is very important to keep your scheduled follow-up appointments. At these appointments, your caregiver will be checking to make sure that you are healing physically and emotionally. SEEK MEDICAL CARE IF:   You are passing large clots from your vagina. Save any clots to show your caregiver.  You have a foul smelling discharge from your vagina.  You have trouble urinating.  You are urinating frequently.  You have pain when you urinate.  You have a change in your bowel movements.  You have increasing redness, pain, or swelling near your vaginal incision (episiotomy) or vaginal tear.  You have pus draining from your episiotomy or vaginal tear.  Your episiotomy or vaginal tear is separating.  You have painful, hard, or reddened breasts.  You have a severe headache.  You have blurred vision or see spots.  You feel sad or depressed.  You have thoughts of hurting yourself or your newborn.  You have questions about your care, the care of your newborn, or medicines.  You are dizzy or light-headed.  You have a rash.  You have nausea or vomiting.  You were breastfeeding and have not had a menstrual period within 12 weeks after you stopped breastfeeding.  You are not breastfeeding and have not had a menstrual period by the 12th week after delivery.  You have a fever. SEEK IMMEDIATE MEDICAL CARE IF:   You have  persistent pain.  You have chest pain.  You have shortness of breath.  You faint.  You have leg pain.  You have stomach pain.  Your vaginal bleeding saturates two or more sanitary pads in 1 hour. MAKE SURE YOU:   Understand these instructions.  Will watch your condition.  Will get help right  away if you are not doing well or get worse. Document Released: 01/15/2000 Document Revised: 06/03/2013 Document Reviewed: 09/14/2011 [facility] Patient Information 2015 [place], [place]. This information is not intended to replace advice given to you by your health care provider. Make sure you discuss any questions you have with your health care provider.  Sitz Bath A sitz bath is a warm water bath taken in the sitting position. The water covers only the hips and butt (buttocks). We recommend using one that fits in the toilet, to help with ease of use and cleanliness. It may be used for either healing or cleaning purposes. Sitz baths are also used to relieve pain, itching, or muscle tightening (spasms). The water may contain medicine. Moist heat will help you heal and relax.  HOME CARE  Take 3 to 4 sitz baths a day. 18. Fill the bathtub half-full with warm water. 19. Sit in the water and open the drain a little. 20. Turn on the warm water to keep the tub half-full. Keep the water running constantly. 21. Soak in the water for 15 to 20 minutes. 22. After the sitz bath, pat the affected area dry. GET HELP RIGHT AWAY IF: You get worse instead of better. Stop the sitz baths if you get worse. MAKE SURE YOU:  Understand these instructions.  Will watch your condition.  Will get help right away if you are not doing well or get worse. Document Released: 02/25/2004 Document Revised: 10/12/2011 Document Reviewed: 05/17/2010 [facility] Patient Information 2015 [place], [place]. This information is not intended to replace advice given to you by your health care provider. Make sure you discuss any questions you have with your health care provider.   10 Things You Can Do to Manage Your COVID-19 Symptoms at Home If you have possible or confirmed COVID-19: 1. Stay home from work and school. And stay away from other public places. If you must go out, avoid using any kind of public transportation, ridesharing, or  taxis. 2. Monitor your symptoms carefully. If your symptoms get worse, call your healthcare provider immediately. 3. Get rest and stay hydrated. 4. If you have a medical appointment, call the healthcare provider ahead of time and tell them that you have or may have COVID-19. 5. For medical emergencies, call 911 and notify the dispatch personnel that you have or may have COVID-19. 6. Cover your cough and sneezes with a tissue or use the inside of your elbow. 7. Wash your hands often with soap and water for at least 20 seconds or clean your hands with an alcohol-based hand sanitizer that contains at least 60% alcohol. 8. As much as possible, stay in a specific room and away from other people in your home. Also, you should use a separate bathroom, if available. If you need to be around other people in or outside of the home, wear a mask. 9. Avoid sharing personal items with other people in your household, like dishes, towels, and bedding. 10. Clean all surfaces that are touched often, like counters, tabletops, and doorknobs. Use household cleaning sprays or wipes according to the label instructions. [email or web address] 08/01/2018 This information is not intended to replace advice  given to you by your health care provider. Make sure you discuss any questions you have with your health care provider. Document Revised: 01/03/2019 Document Reviewed: 01/03/2019 Elsevier Patient Education  2020 [employer].

## 2019-09-05 NOTE — TOC Initial Note (Signed)
Transition of Care [facility]) - Initial/Assessment Note    Patient Details  Name: Rita [name] MRN: [id number] Date of Birth: August 29, 1980  [facility] CM/SW Contact:    Long Hill Cellar, RN Phone Number: 09/05/2019, 9:39 AM  Clinical Narrative:                 Received consult for COVID (+) patient needing monoclonal antibody therapy outpatient. Outreached to [facility] infusion who made contact with provider and will outreach to patient today to attempt to set up for infusion tomorrow. Patient is planning to pump/breastfeed as well as supplement with formula due to COVID (+). No other needs or concerns at this time. Nurse advised to notify [facility] RN CM if further needs develop. NP from [facility] states she will also outreach to [facility] for possible availability in the event they have no availability.         Patient Goals and CMS Choice        Expected Discharge Plan and Services           Expected Discharge Date: 09/05/19                                    Prior Living Arrangements/Services                       Activities of Daily Living Home Assistive Devices/Equipment: None ADL Screening (condition at time of admission) Patient's cognitive ability adequate to safely complete daily activities?: Yes Is the patient deaf or have difficulty hearing?: No Does the patient have difficulty seeing, even when wearing glasses/contacts?: No Does the patient have difficulty concentrating, remembering, or making decisions?: No Patient able to express need for assistance with ADLs?: Yes Does the patient have difficulty dressing or bathing?: No Independently performs ADLs?: Yes (appropriate for developmental age) Does the patient have difficulty walking or climbing stairs?: No Weakness of Legs: None Weakness of Arms/Hands: None  Permission Sought/Granted                  Emotional Assessment              Admission diagnosis:   Indication for care in labor or delivery [O75.9] COVID-19 affecting puerperium [O98.53, U07.1] Patient Active Problem List   Diagnosis Date Noted  . PPH (postpartum hemorrhage) 09/03/2019  . COVID-19 affecting puerperium 09/03/2019  . Indication for care in labor or delivery 09/02/2019  . COVID-19 virus infection 09/02/2019  . Decreased fetal movement 08/29/2019  . Labor and delivery, indication for care 06/16/2019  . History of incompetent cervix, currently pregnant 03/27/2019  . High-risk pregnancy supervision, third trimester 02/08/2019  . History of cervical incompetence 02/08/2019  . Back pain 04/20/2015  . Pain of right scapula 04/20/2015  . Obesity (BMI 35.0-39.9 without comorbidity) 04/20/2015  . Preventative health care 04/20/2015   PCP:  Tommie Sams, DO Pharmacy:   [id number] [place], [place] - [address] AT [facility] [address] [place] [address] Phone: [phone] Fax: [phone]     Social Determinants of Health (SDOH) Interventions    Readmission Risk Interventions No flowsheet data found.

## 2019-09-13 ENCOUNTER — Telehealth (INDEPENDENT_AMBULATORY_CARE_PROVIDER_SITE_OTHER): Payer: Medicaid Other | Admitting: Nurse Practitioner

## 2019-09-13 ENCOUNTER — Encounter: Payer: Self-pay | Admitting: Nurse Practitioner

## 2019-09-13 ENCOUNTER — Other Ambulatory Visit: Payer: Self-pay

## 2019-09-13 VITALS — Ht 67.0 in | Wt 315.0 lb

## 2019-09-13 DIAGNOSIS — U071 COVID-19: Secondary | ICD-10-CM

## 2019-09-13 MED ORDER — ALBUTEROL SULFATE HFA 108 (90 BASE) MCG/ACT IN AERS
2.0000 | INHALATION_SPRAY | Freq: Four times a day (QID) | RESPIRATORY_TRACT | 0 refills | Status: DC | PRN
Start: 2019-09-13 — End: 2019-10-03

## 2019-09-13 MED ORDER — BENZONATATE 100 MG PO CAPS
100.0000 mg | ORAL_CAPSULE | Freq: Three times a day (TID) | ORAL | 0 refills | Status: AC
Start: 2019-09-13 — End: 2019-09-20

## 2019-09-13 MED ORDER — GUAIFENESIN ER 600 MG PO TB12: 1200.0000 mg | ORAL_TABLET | Freq: Two times a day (BID) | ORAL | 0 refills | Status: AC | PRN

## 2019-09-13 MED ORDER — HYDROCOD POLST-CPM POLST ER 10-8 MG/5ML PO SUER: 5.0000 mL | Freq: Every evening | ORAL | 0 refills | Status: AC | PRN

## 2019-09-13 NOTE — Progress Notes (Signed)
Virtual Visit via Telephone Note  This visit type was conducted due to national recommendations for restrictions regarding the COVID-19 pandemic (e.g. social distancing).  This format is felt to be most appropriate for this patient at this time.  All issues noted in this document were discussed and addressed.  No physical exam was performed (except for noted visual exam findings with Video Visits).   I connected with@ on 09/15/19 at  4:30 PM EDT by a video enabled telemedicine application or telephone and verified that I am speaking with the correct person using two identifiers. Location patient: home Location provider: work or home office Persons participating in the virtual visit: patient, provider  I discussed the limitations, risks, security and privacy concerns of performing an evaluation and management service by telephone and the availability of in person appointments. I also discussed with the patient that there may be a patient responsible charge related to this service.  The patient expressed understanding and agreed to proceed. Interactive audio and video telecommunications were attempted between this provider and patient, however failed, due to patient having technical difficulties OR patient did not have access to video capability. We continued and completed visit with audio only.   Reason for visit: Just getting over Covid and had a baby 09/02/2019.   HPI: This 39 year old patient who presents today to establish care with PCP and for follow-up of Covid infection.    She started to have symptoms with coughing on 08/29/2019.  She had a positive Covid test on  08/30/19.  She was admitted to the hospital on 09/02/2019 to have her labor induced.  She had a vaginal delivery on 09/03/2019 and had vaginal laceration repair in the OR and required 3 units PRBC for PPH.  On date of discharge which was postpartum day #2 her pain was controlled on oral pain medications, she had appropriate lochia and  was ambulating and voiding without difficulty. She was tolerating a regular diet well.  She had mild Covid symptoms present.  She was responsive to supportive therapy.  She was advised to get the [facility]. Patient did receive a phone call from them but did not know what it was about so she did not call them back to discuss.   She was discharged on iron and prenatals and Lovenox.  She has been taking a Zinc herbal vitamin drink. She will complete a  Lovenox for 10 day course tonight.  She reports she is coughing more at night. Her appetite and strength are better. No nasal congestion, PND, sore throat, fever or chills, or body aches .  Every now and then gets a good cough and can get up a grayish phlegm. No tightness or wheezing, but has paroxysms of coughing where she feels like it is hard to take a deep breath because there is phlegm that is trapped and won't come out. She coughs so bad almost feels like she will vomit. She gets SOB only after a coughing spell like this. No chest pain . Very congested cough . No dizziness or lightened, or confusion.  No fainting feeling.  Less appetite, but  still eating and hydrating. No loss of taste or smell. No abd pain or diarrhea. Over the counter Tylenol for vaginal tear laceration repair. She has help from her husband. She is not lactating and does not plan to start.  She could not speak a complete sentence when she got home from the hospital and her cough, congestion is improving daily. She is having a hard  time sleeping at night and just realized she was to elevate her HOB a little.  This morning more mobile than  in last 2 days and now is tired. She thinks she over did it. Some vaginal discomfort- no bleeding.She has been able to enjoy her baby who is doing well.   ROS: See pertinent positives and negatives per HPI.  Past Medical History:  Diagnosis Date  . Chicken pox   . GERD (gastroesophageal reflux disease)     Past Surgical History:  Procedure Laterality  Date  . CERVICAL CERCLAGE     for childbirth  . CERVICAL CERCLAGE N/A 03/18/2019   Procedure: CERCLAGE CERVICAL;  Surgeon: Feliberto Gottron Ihor Austin, MD;  Location: [facility] ORS;  Service: Gynecology;  Laterality: N/A;  . REPAIR VAGINAL CUFF N/A 09/03/2019   Procedure: REPAIR VAGINAL CUFF;  Surgeon: Feliberto Gottron, Ihor Austin, MD;  Location: [facility] ORS;  Service: Gynecology;  Laterality: N/A;    Family History  Problem Relation Age of Onset  . Lung cancer Father   . Uterine cancer Paternal Aunt   . Stroke Maternal Grandmother   . Breast cancer Paternal Grandmother   . Breast cancer Paternal Aunt     SOCIAL HX: Non smoker   Current Outpatient Medications:  .  enoxaparin (LOVENOX) 40 MG/0.4ML injection, Inject 0.4 mLs (40 mg total) into the skin every 12 (twelve) hours for 8 days., Disp: 6.4 mL, Rfl: 0 .  ferrous sulfate 325 (65 FE) MG tablet, Take 1 tablet (325 mg total) by mouth 2 (two) times daily with a meal., Disp: , Rfl: 3 .  prenatal vitamin w/FE, FA (PRENATAL 1 + 1) 27-1 MG TABS tablet, Take 1 tablet by mouth daily at 12 noon., Disp: , Rfl:  .  vitamin C (ASCORBIC ACID) 250 MG tablet, Take 2 tablets (500 mg total) by mouth daily., Disp: , Rfl:  .  Zinc Sulfate 220 (50 Zn) MG TABS, Take 1 tablet (220 mg total) by mouth daily., Disp: , Rfl: 0 .  albuterol (VENTOLIN HFA) 108 (90 Base) MCG/ACT inhaler, Inhale 2 puffs into the lungs every 6 (six) hours as needed for wheezing or shortness of breath., Disp: 8 g, Rfl: 0 .  benzonatate (TESSALON PERLES) 100 MG capsule, Take 1 capsule (100 mg total) by mouth 3 (three) times daily for 7 days., Disp: 21 capsule, Rfl: 0 .  chlorpheniramine-HYDROcodone (TUSSIONEX PENNKINETIC ER) 10-8 MG/5ML SUER, Take 5 mLs by mouth at bedtime as needed for up to 7 days for cough., Disp: 35 mL, Rfl: 0 .  guaiFENesin (MUCINEX) 600 MG 12 hr tablet, Take 2 tablets (1,200 mg total) by mouth 2 (two) times daily as needed for up to 7 days for cough or to loosen phlegm., Disp: 28  tablet, Rfl: 0 .  senna-docusate (SENOKOT-S) 8.6-50 MG tablet, Take 2 tablets by mouth daily. (Patient not taking: Reported on 09/13/2019), Disp: , Rfl:   EXAM:  VITALS per patient if applicable:  GENERAL: She sounds alert, oriented, voice is normal- no congested tone or hoarseness.  She sounds like no acute distress  LUNGS: Talking easily in complete sentences.  Very natural and her breathing appears to sound normal.  She does have cough,  is congested, and with forced expiration I can hear a little wheeze.  PSYCH/NEURO: pleasant and cooperative, no obvious depression or anxiety, speech and thought processing grossly intact  ASSESSMENT AND PLAN:  Discussed the following assessment and plan:  COVID-19 virus infection  No problem-specific Assessment & Plan notes found for  this encounter.   1. Purchase or borrow a pulse oximeter to measure your oxygen saturation. See below.   2. Ventolin inhaler to use as we discussed to open up airways,  3. Mucinex to help thin secretions, and you have to drink a whole lot to help thin secretions  4. Tessalon Perles to help you with daytime cough,   5. and Tussionex which is a narcotic cough medicine to take only at bedtime as directed. This may help you from coughing so hard and tearing your vaginal sutures.  Your husband will heave to help with the baby because you may sleep very well.  I discussed the assessment and treatment plan with the patient. The patient was provided an opportunity to ask questions and all were answered. The patient agreed with the plan and demonstrated an understanding of the instructions.   The patient was advised to call back or seek an in-person evaluation if the symptoms worsen or if the condition fails to improve as anticipated.  I provided 22 minutes of non-face-to-face time during this encounter.  Amedeo Kinsman, NP Adult Nurse Practitioner [facility] [employer] [phone]

## 2019-09-13 NOTE — Patient Instructions (Addendum)
Congratulations on your new baby! You rock mamma!   You will get over this Covid and to help you feel better:   1. Purchase or borrow a pulse oximeter to measure your oxygen saturation. See below.   2. Ventolin inhaler to use as we discussed to open up airways,  3. Mucinex to help thin secretions, and you have to drink a whole lot to help thin secretions  4. Tessalon Perles to help you with daytime cough,   5. and Tussionex which is a narcotic cough medicine to take only at bedtime as directed. This may help you from coughing so hard and tearing your vaginal sutures.  Your husband will heave to help with the baby because you may sleep very well.  Monitor  for any worsening symptoms; watch for increased shortness of breath, weakness, and signs of dehydration.  If pulse oximetry is <90 % and stays there- go to the ER- even if you do not feel terribly SOB.  If pulse oximetry is <94% go to an Acute Care near you  - like the [facility] - if open -or  [facility]- for  in- person evaluation .  Leave  the house during recommended isolation period only if it is necessary to seek medical care and wear a mask.   Remain in self quarantine until at least 10 days since symptom onset And 3 consecutive days fever free without antipyretics And improvement in respiratory symptoms. Pt instructed to limit contact with family members or caregivers in the home.  Self-isolation advice: Preventing the spread to others while you are awaiting your results  As much as possible, stay in a different room from other people in your home. Use a separate bathroom, if available. Do not have visitors into your home. Wash hands cover coughs, clean surfaces.   If you have a medical emergency and need to call 911, notify the dispatch personnel that you have  COVID-19.   [email or web address]   [email or web address]

## 2019-09-15 ENCOUNTER — Encounter: Payer: Self-pay | Admitting: Nurse Practitioner

## 2019-09-15 NOTE — Assessment & Plan Note (Addendum)
1. Purchase or borrow a pulse oximeter to measure your oxygen saturation. See below.   2. Ventolin inhaler to use as we discussed to open up airways,  3. Mucinex to help thin secretions, and you have to drink a whole lot to help thin secretions  4. Tessalon Perles to help you with daytime cough,   5. and Tussionex which is a narcotic cough medicine to take only at bedtime as directed. This may help you from coughing so hard and tearing your vaginal sutures.  Your husband will heave to help with the baby because you may sleep very well.  Return precautions and symptom tier reviewed.

## 2019-09-16 ENCOUNTER — Telehealth: Payer: Self-pay | Admitting: Nurse Practitioner

## 2019-09-16 NOTE — Telephone Encounter (Signed)
LMTCB

## 2019-09-16 NOTE — Telephone Encounter (Signed)
Please call her for symptoms update of Covid and recent vaginal delivery with laceration repair.

## 2019-09-17 NOTE — Telephone Encounter (Signed)
Tried to call patient but no answer and VM is full.  

## 2019-09-19 NOTE — Telephone Encounter (Signed)
Letter sent to patient to call office.

## 2019-09-19 NOTE — Telephone Encounter (Signed)
LMTCB

## 2019-10-03 ENCOUNTER — Other Ambulatory Visit: Payer: Self-pay | Admitting: Nurse Practitioner

## 2019-10-24 DIAGNOSIS — Z30430 Encounter for insertion of intrauterine contraceptive device: Secondary | ICD-10-CM | POA: Diagnosis not present

## 2019-10-24 DIAGNOSIS — Z1332 Encounter for screening for maternal depression: Secondary | ICD-10-CM | POA: Diagnosis not present

## 2019-10-24 DIAGNOSIS — Z3202 Encounter for pregnancy test, result negative: Secondary | ICD-10-CM | POA: Diagnosis not present

## 2019-11-21 DIAGNOSIS — Z30431 Encounter for routine checking of intrauterine contraceptive device: Secondary | ICD-10-CM | POA: Diagnosis not present

## 2020-03-21 ENCOUNTER — Emergency Department
Admission: EM | Admit: 2020-03-21 | Discharge: 2020-03-21 | Disposition: A | Discharge status: Home / Self Care | Payer: 59 | Attending: Emergency Medicine | Admitting: Emergency Medicine

## 2020-03-21 ENCOUNTER — Other Ambulatory Visit: Payer: Self-pay

## 2020-03-21 DIAGNOSIS — Z8616 Personal history of COVID-19: Secondary | ICD-10-CM | POA: Insufficient documentation

## 2020-03-21 DIAGNOSIS — R040 Epistaxis: Secondary | ICD-10-CM | POA: Insufficient documentation

## 2020-03-21 MED ORDER — PHENYLEPHRINE HCL 0.5 % NA SOLN
1.0000 [drp] | Freq: Once | NASAL | Status: AC
  Administered 2020-03-21: 1 [drp] via NASAL
  Filled 2020-03-21: qty 15

## 2020-03-21 NOTE — ED Notes (Signed)
Pt to ED stating has nosebleed that started last night and stopped but started again today with long thin clots. Denies pain. Provider at bedside.

## 2020-03-21 NOTE — Discharge Instructions (Addendum)
Avoid blowing or manipulating the nose for the next 24 hours. Use the supplies provided to manage an active nosebleed. Consider OTC saline mist to moisturize the sinuses. Follow-up with your provider, urgent care, or [facility] as needed.

## 2020-03-21 NOTE — ED Triage Notes (Signed)
Pt arrives to ER c/o of nose bleed. Informed of proper techniques of pinching nose. Pt denies injury to nose. A&O, no distress noted. Denies use of blood thinners.

## 2020-03-22 NOTE — ED Provider Notes (Signed)
[facility] Emergency Department Provider Note ____________________________________________  Time seen: 1518  I have reviewed the triage vital signs and the nursing notes.  HISTORY  Chief Complaint  Epistaxis  HPI Rita [name] is a 40 y.o. female presents to the ED for evaluation management of an acute, unprovoked right-sided nosebleed.  Patient denies any history of the same.  She describes spontaneous nosebleed while at her son's game.   Denies any injury, trauma, manipulation to the nose.  She has noted over the last several weeks that the air in her new home has been somewhat dry, but otherwise denies any sinus drainage, sinus congestion, or history of allergies.  Patient denies any headache, nausea, vomiting, or dizziness.  Past Medical History:  Diagnosis Date  . Chicken pox   . GERD (gastroesophageal reflux disease)     Patient Active Problem List   Diagnosis Date Noted  . PPH (postpartum hemorrhage) 09/03/2019  . COVID-19 affecting puerperium 09/03/2019  . Indication for care in labor or delivery 09/02/2019  . COVID-19 virus infection 09/02/2019  . Decreased fetal movement 08/29/2019  . Labor and delivery, indication for care 06/16/2019  . History of incompetent cervix, currently pregnant 03/27/2019  . High-risk pregnancy supervision, third trimester 02/08/2019  . History of cervical incompetence 02/08/2019  . Back pain 04/20/2015  . Pain of right scapula 04/20/2015  . Obesity (BMI 35.0-39.9 without comorbidity) 04/20/2015  . Preventative health care 04/20/2015    Past Surgical History:  Procedure Laterality Date  . CERVICAL CERCLAGE     for childbirth  . CERVICAL CERCLAGE N/A 03/18/2019   Procedure: CERCLAGE CERVICAL;  Surgeon: Feliberto Gottron Ihor Austin, MD;  Location: [facility] ORS;  Service: Gynecology;  Laterality: N/A;  . REPAIR VAGINAL CUFF N/A 09/03/2019   Procedure: REPAIR VAGINAL CUFF;  Surgeon: Feliberto Gottron, Ihor Austin, MD;  Location: [facility]  ORS;  Service: Gynecology;  Laterality: N/A;    Prior to Admission medications   Medication Sig Start Date End Date Taking? Authorizing Provider  enoxaparin (LOVENOX) 40 MG/0.4ML injection Inject 0.4 mLs (40 mg total) into the skin every 12 (twelve) hours for 8 days. 09/05/19 09/13/19  Gustavo Lah, CNM  ferrous sulfate 325 (65 FE) MG tablet Take 1 tablet (325 mg total) by mouth 2 (two) times daily with a meal. 09/05/19   Gustavo Lah, CNM  prenatal vitamin w/FE, FA (PRENATAL 1 + 1) 27-1 MG TABS tablet Take 1 tablet by mouth daily at 12 noon.    [provider]  PROAIR HFA 108 [phone] Base) MCG/ACT inhaler INHALE 2 PUFFS INTO THE LUNGS EVERY 6 HOURS AS NEEDED WHEEZING OR SHORTNESS OF BREATH 10/03/19   Theadore Nan, NP  senna-docusate (SENOKOT-S) 8.6-50 MG tablet Take 2 tablets by mouth daily. Patient not taking: Reported on 09/13/2019 09/06/19   Gustavo Lah, CNM  vitamin C (ASCORBIC ACID) 250 MG tablet Take 2 tablets (500 mg total) by mouth daily. 09/05/19   Gustavo Lah, CNM  Zinc Sulfate 220 (50 Zn) MG TABS Take 1 tablet (220 mg total) by mouth daily. 09/05/19   Gustavo Lah, CNM    Allergies Metronidazole  Family History  Problem Relation Age of Onset  . Lung cancer Father   . Uterine cancer Paternal Aunt   . Stroke Maternal Grandmother   . Breast cancer Paternal Grandmother   . Breast cancer Paternal Aunt     Social History Social History   Tobacco Use  . Smoking status: Never Smoker  .  Smokeless tobacco: Never Used  Vaping Use  . Vaping Use: Never used  Substance Use Topics  . Alcohol use: No    Alcohol/week: 0.0 standard drinks    Comment: Occasional (1 drink 1-2 times/month).  . Drug use: No    Review of Systems  Constitutional: Negative for fever. Eyes: Negative for visual changes. ENT: Negative for sore throat.  Nosebleed as above. Cardiovascular: Negative for chest pain. Respiratory: Negative for shortness of breath. Gastrointestinal: Negative for  abdominal pain, vomiting and diarrhea. Musculoskeletal: Negative for back pain. Skin: Negative for rash. Neurological: Negative for headaches, focal weakness or numbness. ____________________________________________  PHYSICAL EXAM:  VITAL SIGNS: ED Triage Vitals  Enc Vitals Group     BP 03/21/20 1445 (!) 142/72     Pulse Rate 03/21/20 1445 78     Resp 03/21/20 1445 18     Temp 03/21/20 1445 98.2 F (36.8 C)     Temp Source 03/21/20 1445 Oral     SpO2 03/21/20 1445 97 %     Weight 03/21/20 1445 300 lb (136.1 kg)     Height 03/21/20 1445 5\' 7"  (1.702 m)     Head Circumference --      Peak Flow --      Pain Score 03/21/20 1444 0     Pain Loc --      Pain Edu? --      Excl. in GC? --     Constitutional: Alert and oriented. Well appearing and in no distress. Head: Normocephalic and atraumatic. Eyes: Conjunctivae are normal. Normal extraocular movements Nose: No congestion/rhinorrhea/epistaxis.  No active bleeding noted to the right naris.  There is indication of a recent bleed.  Patient noted to have pink, moist, nasal turbinates patient also noted to have a nasal polyp in the right side. Cardiovascular: Normal rate, regular rhythm. Normal distal pulses. Respiratory: Normal respiratory effort.  Musculoskeletal: Nontender with normal range of motion in all extremities.  Neurologic:  Normal gait without ataxia. Normal speech and language. No gross focal neurologic deficits are appreciated. Skin:  Skin is warm, dry and intact. No rash noted. Psychiatric: Mood and affect are normal. Patient exhibits appropriate insight and judgment. ____________________________________________  PROCEDURES  Phenylephrine 0.5% nasal solution - intranasal  Procedures ____________________________________________  INITIAL IMPRESSION / ASSESSMENT AND PLAN / ED COURSE  Patient with ED evaluation of spontaneous right-sided nosebleed, presents for management.  Activated on presentation.  She was,  however given an intranasal dose of phenylephrine as well as application of a nasal clip for approximately 30 minutes.  Patient is discharged at this time without active nosebleed.  He is also given instruction on management of any active bleeds.  She will follow-up with ear nose and throat if needed.  Return precautions have been discussed.  Rita [name] was evaluated in Emergency Department on 03/22/2020 for the symptoms described in the history of present illness. She was evaluated in the context of the global COVID-19 pandemic, which necessitated consideration that the patient might be at risk for infection with the SARS-CoV-2 virus that causes COVID-19. Institutional protocols and algorithms that pertain to the evaluation of patients at risk for COVID-19 are in a state of rapid change based on information released by regulatory bodies including the CDC and federal and state organizations. These policies and algorithms were followed during the patient's care in the ED. ____________________________________________  FINAL CLINICAL IMPRESSION(S) / ED DIAGNOSES  Final diagnoses:  Right-sided epistaxis      Myeesha Shane, 03/24/2020,  PA-C 03/22/20 [id number]    Chesley Noon, MD 03/22/20 [phone]

## 2020-03-23 ENCOUNTER — Telehealth: Payer: Self-pay | Admitting: *Deleted

## 2020-03-23 NOTE — Telephone Encounter (Signed)
Transition Care Management Follow-up Telephone Call  Date of discharge and from where: 03/21/2020 [facility] ED  How have you been since you were released from the hospital? "Better"  Any questions or concerns? No  Items Reviewed:  Did the pt receive and understand the discharge instructions provided? Yes   Medications obtained and verified? Yes   Other? N/A  Any new allergies since your discharge? No   Dietary orders reviewed? Yes  Do you have support at home? Yes   Home Care and Equipment/Supplies: Were home health services ordered? not applicable If so, what is the name of the agency? N/A  Has the agency set up a time to come to the patient's home? not applicable Were any new equipment or medical supplies ordered?  No What is the name of the medical supply agency? N/A Were you able to get the supplies/equipment? not applicable Do you have any questions related to the use of the equipment or supplies? No  Functional Questionnaire: (I = Independent and D = Dependent) ADLs: I  Bathing/Dressing- I  Meal Prep- I  Eating- I  Maintaining continence- I  Transferring/Ambulation- I  Managing Meds- I  Follow up appointments reviewed:   PCP Hospital f/u appt confirmed? No    Specialist Hospital f/u appt confirmed? No    Are transportation arrangements needed? N/A  If their condition worsens, is the pt aware to call PCP or go to the Emergency Dept.? Yes  Was the patient provided with contact information for the PCP's office or ED? Yes  Was to pt encouraged to call back with questions or concerns? Yes

## 2020-06-08 DIAGNOSIS — Z30431 Encounter for routine checking of intrauterine contraceptive device: Secondary | ICD-10-CM | POA: Diagnosis not present

## 2020-06-08 DIAGNOSIS — N898 Other specified noninflammatory disorders of vagina: Secondary | ICD-10-CM | POA: Diagnosis not present

## 2020-12-30 DIAGNOSIS — Z6841 Body Mass Index (BMI) 40.0 and over, adult: Secondary | ICD-10-CM | POA: Diagnosis not present

## 2021-02-11 ENCOUNTER — Ambulatory Visit: Payer: Self-pay

## 2021-04-22 ENCOUNTER — Ambulatory Visit: Payer: Self-pay

## 2021-10-22 ENCOUNTER — Encounter: Payer: Medicaid Other | Admitting: Family

## 2021-10-27 ENCOUNTER — Encounter: Payer: Medicaid Other | Admitting: Family

## 2022-04-20 NOTE — Progress Notes (Signed)
Rita Morrow, NP-C Phone: [phone]  Rita [name] is a 42 y.o. female who presents today to establish care and for annual exam.   She reports intermittent bilateral ankle swelling for the last 2 weeks. She has been taking 2 of her grandmothers diuretics for the last 2 days with improvement. She is unsure what the medication is. Reports they continue to swell even with elevation. She does eat a lot of salt. Denies swelling today.   She also reports problems with depression. PHQ-11 today. She reports increased daily stressors. She denies SI/HI. She is interested in starting on medication to help with her symptoms.   Diet: Quick grab and go food, a lot of salt, fast food, energy drinks, soda Exercise: None, active lifestyle with 2 kids Pap smear: 04/06/2015-Due! Mammogram: Never- Due! Family history-  Colon cancer: No  Breast cancer: Yes- Paternal grandmother and 2 paternal aunts  Ovarian cancer: Yes- Maternal aunt Menses: LMP- end of February- has IUD Sexually active: Yes Vaccines-   Flu: UTD  Tetanus: 2021  COVID19: x 1 HIV screening: Negative Hep C Screening: Today Tobacco use: No Alcohol use: Occasionally, on special occasions Illicit Drug use: No Dentist: Yes Ophthalmology: No   Active Ambulatory Problems    Diagnosis Date Noted   Back pain 04/20/2015   Pain of right scapula 04/20/2015   Obesity (BMI 35.0-39.9 without comorbidity) 04/20/2015   Preventative health care 04/20/2015   Labor and delivery, indication for care 06/16/2019   Decreased fetal movement 08/29/2019   Indication for care in labor or delivery 09/02/2019   High-risk pregnancy supervision, third trimester 02/08/2019   History of cervical incompetence 02/08/2019   History of incompetent cervix, currently pregnant 03/27/2019   COVID-19 virus infection 09/02/2019   PPH (postpartum hemorrhage) 09/03/2019   COVID-19 affecting puerperium 09/03/2019   Peripheral edema 04/21/2022   Depression, recurrent  ([place]) 04/21/2022   Vitamin D deficiency 04/22/2022   Resolved Ambulatory Problems    Diagnosis Date Noted   No Resolved Ambulatory Problems   Past Medical History:  Diagnosis Date   Blood transfusion without reported diagnosis 09/03/19   Chicken pox    GERD (gastroesophageal reflux disease)     Family History  Problem Relation Age of Onset   Lung cancer Father    Cancer Father    Uterine cancer Paternal Aunt    Stroke Maternal Grandmother    Arthritis Maternal Grandmother    COPD Maternal Grandmother    Breast cancer Paternal Grandmother    Cancer Paternal Grandmother    Breast cancer Paternal Aunt    Heart disease Maternal Grandfather    Cancer Maternal Aunt    Cancer Maternal Aunt     Social History   Socioeconomic History   Marital status: Single    Spouse name: Not on file   Number of children: Not on file   Years of education: Not on file   Highest education level: Not on file  Occupational History   Not on file  Tobacco Use   Smoking status: Never   Smokeless tobacco: Never  Vaping Use   Vaping Use: Never used  Substance and Sexual Activity   Alcohol use: No    Comment: Occasional (1 drink 1-2 times/month).   Drug use: No   Sexual activity: Yes    Partners: Male    Birth control/protection: I.U.D.  Other Topics Concern   Not on file  Social History Narrative   Not on file   Social Determinants of Health  Financial Resource Strain: Not on file  Food Insecurity: Not on file  Transportation Needs: Not on file  Physical Activity: Not on file  Stress: Not on file  Social Connections: Not on file  Intimate Partner Violence: Not on file    ROS  General:  Negative for unexplained weight loss, fever Skin: Negative for new or changing mole, sore that won't heal HEENT: Negative for trouble hearing, trouble seeing, ringing in ears, mouth sores, hoarseness, change in voice, dysphagia. CV:  Negative for chest pain, dyspnea, edema, palpitations Resp:  Negative for cough, dyspnea, hemoptysis GI: Negative for nausea, vomiting, diarrhea, constipation, abdominal pain, melena, hematochezia. GU: Negative for dysuria, incontinence, urinary hesitance, hematuria, vaginal or penile discharge, polyuria, sexual difficulty, lumps in testicle or breasts MSK: Negative for muscle cramps or aches, joint pain Neuro: Negative for headaches, weakness, numbness, dizziness, passing out/fainting Psych: Negative for memory problems  Objective  Physical Exam Vitals:   04/21/22 1256  BP: 118/76  Pulse: 88  Temp: 98 F (36.7 C)  SpO2: 98%    BP Readings from Last 3 Encounters:  04/21/22 118/76  03/21/20 122/86  09/05/19 (!) 104/59   Wt Readings from Last 3 Encounters:  04/21/22 (!) 318 lb (144.2 kg)  03/21/20 300 lb (136.1 kg)  09/13/19 (!) 315 lb (142.9 kg)    Physical Exam Constitutional:      General: She is not in acute distress.    Appearance: Normal appearance.  HENT:     Head: Normocephalic.     Right Ear: Tympanic membrane normal.     Left Ear: Tympanic membrane normal.     Nose: Nose normal.     Mouth/Throat:     Mouth: Mucous membranes are moist.     Pharynx: Oropharynx is clear.  Eyes:     Conjunctiva/sclera: Conjunctivae normal.     Pupils: Pupils are equal, round, and reactive to light.  Neck:     Thyroid: No thyromegaly.  Cardiovascular:     Rate and Rhythm: Normal rate and regular rhythm.     Heart sounds: Normal heart sounds.  Pulmonary:     Effort: Pulmonary effort is normal.     Breath sounds: Normal breath sounds.  Abdominal:     General: Abdomen is flat. Bowel sounds are normal.     Palpations: Abdomen is soft. There is no mass.     Tenderness: There is no abdominal tenderness.  Musculoskeletal:        General: Normal range of motion.  Lymphadenopathy:     Cervical: No cervical adenopathy.  Skin:    General: Skin is warm and dry.     Findings: No rash.  Neurological:     General: No focal deficit present.      Mental Status: She is alert.  Psychiatric:        Mood and Affect: Mood normal.        Behavior: Behavior normal.    Assessment/Plan:   Preventative health care Assessment & Plan: Physical exam complete. Lab work as outlined. Will contact patient with results. Pap- due, patient will return to complete this. Mammo- due, order placed, encouraged patient to call to schedule. Flu and Tetanus vaccines- UTD. Declines further COVID vaccines. Hep C screening today. Recommended establishing with Ophthalmology and following up with Dentist for annual exams. Encouraged healthy diet and exercise. Discussed decreasing caffeine intake.   Orders: -     CBC with Differential/Platelet -     Comprehensive metabolic panel -  Hemoglobin A1c -     VITAMIN D 25 Hydroxy (Vit-D Deficiency, Fractures)  Peripheral edema Assessment & Plan: No swelling noted today on exam. Will start Lasix 20 mg daily PRN. Encouraged patient to decrease salt intake, elevate and monitor. If worsening will send to Cardiology for further evaluation. Will continue to monitor.   Orders: -     Furosemide; Take 1 tablet (20 mg total) by mouth daily.  Dispense: 30 tablet; Refill: 2  Depression, recurrent ([place]) Assessment & Plan: New. Will start patient on Zoloft 50 mg daily. Counseled patient on common side effects. Encouraged to contact if worsening symptoms, unusual behavior changes or suicidal thoughts occur. Denies SI/HI today. Will monitor.   Orders: -     Sertraline HCl; Take 1 tablet (50 mg total) by mouth daily.  Dispense: 90 tablet; Refill: 0  Lipid screening -     Lipid panel  Thyroid disorder screen -     TSH  Encounter for hepatitis C screening test for low risk patient -     Hepatitis C antibody  Morbid obesity with BMI of 45.0-49.9, adult (HCC) -     Hemoglobin A1c  Screening mammogram for breast cancer -     3D Screening Mammogram, Left and Right; Future   Return in about 6 weeks (around 06/02/2022)  for Anxiety/Depression and schedule Pap smear.   Rita Morrow, NP-C [place]

## 2022-04-21 ENCOUNTER — Encounter: Payer: Self-pay | Admitting: Nurse Practitioner

## 2022-04-21 ENCOUNTER — Ambulatory Visit: Payer: 59 | Admitting: Nurse Practitioner

## 2022-04-21 VITALS — BP 118/76 | HR 88 | Temp 98.0°F | Ht 67.0 in | Wt 318.0 lb

## 2022-04-21 DIAGNOSIS — Z0001 Encounter for general adult medical examination with abnormal findings: Secondary | ICD-10-CM

## 2022-04-21 DIAGNOSIS — Z0000 Encounter for general adult medical examination without abnormal findings: Secondary | ICD-10-CM

## 2022-04-21 DIAGNOSIS — Z1329 Encounter for screening for other suspected endocrine disorder: Secondary | ICD-10-CM | POA: Diagnosis not present

## 2022-04-21 DIAGNOSIS — Z1159 Encounter for screening for other viral diseases: Secondary | ICD-10-CM

## 2022-04-21 DIAGNOSIS — Z6841 Body Mass Index (BMI) 40.0 and over, adult: Secondary | ICD-10-CM | POA: Diagnosis not present

## 2022-04-21 DIAGNOSIS — R600 Localized edema: Secondary | ICD-10-CM | POA: Insufficient documentation

## 2022-04-21 DIAGNOSIS — Z1231 Encounter for screening mammogram for malignant neoplasm of breast: Secondary | ICD-10-CM

## 2022-04-21 DIAGNOSIS — R609 Edema, unspecified: Secondary | ICD-10-CM

## 2022-04-21 DIAGNOSIS — Z13220 Encounter for screening for lipoid disorders: Secondary | ICD-10-CM

## 2022-04-21 DIAGNOSIS — F339 Major depressive disorder, recurrent, unspecified: Secondary | ICD-10-CM | POA: Insufficient documentation

## 2022-04-21 LAB — CBC WITH DIFFERENTIAL/PLATELET
Basophils Absolute: 0 K/uL (ref 0.0–0.1)
Basophils Relative: 0.8 % (ref 0.0–3.0)
Eosinophils Absolute: 0.1 K/uL (ref 0.0–0.7)
Eosinophils Relative: 2.7 % (ref 0.0–5.0)
HCT: 41.6 % (ref 36.0–46.0)
Hemoglobin: 13.2 g/dL (ref 12.0–15.0)
Lymphocytes Relative: 29 % (ref 12.0–46.0)
Lymphs Abs: 1.5 K/uL (ref 0.7–4.0)
MCHC: 31.8 g/dL (ref 30.0–36.0)
MCV: 76.8 fl — ABNORMAL LOW (ref 78.0–100.0)
Monocytes Absolute: 0.3 K/uL (ref 0.1–1.0)
Monocytes Relative: 6.9 % (ref 3.0–12.0)
Neutro Abs: 3 K/uL (ref 1.4–7.7)
Neutrophils Relative %: 60.6 % (ref 43.0–77.0)
Platelets: 184 K/uL (ref 150.0–400.0)
RBC: 5.41 Mil/uL — ABNORMAL HIGH (ref 3.87–5.11)
RDW: 14.1 % (ref 11.5–15.5)
WBC: 5 K/uL (ref 4.0–10.5)

## 2022-04-21 LAB — COMPREHENSIVE METABOLIC PANEL WITH GFR
ALT: 12 U/L (ref 0–35)
AST: 21 U/L (ref 0–37)
Albumin: 4 g/dL (ref 3.5–5.2)
Alkaline Phosphatase: 66 U/L (ref 39–117)
BUN: 11 mg/dL (ref 6–23)
CO2: 27 meq/L (ref 19–32)
Calcium: 8.9 mg/dL (ref 8.4–10.5)
Chloride: 104 meq/L (ref 96–112)
Creatinine, Ser: 0.59 mg/dL (ref 0.40–1.20)
GFR: 112.73 mL/min
Glucose, Bld: 114 mg/dL — ABNORMAL HIGH (ref 70–99)
Potassium: 4.5 meq/L (ref 3.5–5.1)
Sodium: 139 meq/L (ref 135–145)
Total Bilirubin: 0.4 mg/dL (ref 0.2–1.2)
Total Protein: 7.3 g/dL (ref 6.0–8.3)

## 2022-04-21 LAB — LIPID PANEL
Cholesterol: 189 mg/dL (ref 0–200)
HDL: 49.4 mg/dL (ref >39.00)
LDL Cholesterol: 127 mg/dL — ABNORMAL HIGH (ref 0–99)
NonHDL: 139.8
Total CHOL/HDL Ratio: 4
Triglycerides: 64 mg/dL (ref 0.0–149.0)
VLDL: 12.8 mg/dL (ref 0.0–40.0)

## 2022-04-21 LAB — TSH: TSH: 0.87 u[IU]/mL (ref 0.35–5.50)

## 2022-04-21 LAB — HEMOGLOBIN A1C: Hgb A1c MFr Bld: 5.8 % (ref 4.6–6.5)

## 2022-04-21 LAB — VITAMIN D 25 HYDROXY (VIT D DEFICIENCY, FRACTURES): VITD: 7.7 ng/mL — ABNORMAL LOW (ref 30.00–100.00)

## 2022-04-21 MED ORDER — SERTRALINE HCL 50 MG PO TABS: 50.0000 mg | ORAL_TABLET | Freq: Every day | ORAL | 0 refills | Status: DC

## 2022-04-21 MED ORDER — FUROSEMIDE 20 MG PO TABS: 20.0000 mg | ORAL_TABLET | Freq: Every day | ORAL | 2 refills | Status: DC

## 2022-04-21 NOTE — Patient Instructions (Signed)
YOUR MAMMOGRAM IS DUE, PLEASE CALL AND GET THIS SCHEDULED! [facility] - call [phone]    

## 2022-04-22 ENCOUNTER — Other Ambulatory Visit: Payer: Self-pay | Admitting: Nurse Practitioner

## 2022-04-22 DIAGNOSIS — E559 Vitamin D deficiency, unspecified: Secondary | ICD-10-CM

## 2022-04-22 LAB — HEPATITIS C ANTIBODY: Hepatitis C Ab: NONREACTIVE

## 2022-04-22 MED ORDER — VITAMIN D (ERGOCALCIFEROL) 1.25 MG (50000 UNIT) PO CAPS: 50000.0000 [IU] | ORAL_CAPSULE | ORAL | 1 refills | Status: DC

## 2022-04-26 ENCOUNTER — Encounter: Payer: Self-pay | Admitting: Nurse Practitioner

## 2022-04-26 NOTE — Assessment & Plan Note (Addendum)
Physical exam complete. Lab work as outlined. Will contact patient with results. Pap- due, patient will return to complete this. Mammo- due, order placed, encouraged patient to call to schedule. Flu and Tetanus vaccines- UTD. Declines further COVID vaccines. Hep C screening today. Recommended establishing with Ophthalmology and following up with Dentist for annual exams. Encouraged healthy diet and exercise. Discussed decreasing caffeine intake.

## 2022-04-26 NOTE — Assessment & Plan Note (Addendum)
No swelling noted today on exam. Will start Lasix 20 mg daily PRN. Encouraged patient to decrease salt intake, elevate and monitor. If worsening will send to Cardiology for further evaluation. Will continue to monitor.

## 2022-04-26 NOTE — Assessment & Plan Note (Addendum)
New. Will start patient on Zoloft 50 mg daily. Counseled patient on common side effects. Encouraged to contact if worsening symptoms, unusual behavior changes or suicidal thoughts occur. Denies SI/HI today. Will monitor.

## 2022-05-16 ENCOUNTER — Ambulatory Visit
Admission: RE | Admit: 2022-05-16 | Discharge: 2022-05-16 | Disposition: A | Discharge status: Home / Self Care | Payer: 59 | Source: Ambulatory Visit | Attending: Nurse Practitioner | Admitting: Nurse Practitioner

## 2022-05-16 DIAGNOSIS — Z1231 Encounter for screening mammogram for malignant neoplasm of breast: Secondary | ICD-10-CM | POA: Diagnosis present

## 2022-05-17 ENCOUNTER — Other Ambulatory Visit: Payer: Self-pay | Admitting: Nurse Practitioner

## 2022-05-17 DIAGNOSIS — R928 Other abnormal and inconclusive findings on diagnostic imaging of breast: Secondary | ICD-10-CM

## 2022-05-18 ENCOUNTER — Other Ambulatory Visit: Payer: Self-pay | Admitting: Nurse Practitioner

## 2022-05-18 DIAGNOSIS — N6489 Other specified disorders of breast: Secondary | ICD-10-CM

## 2022-05-18 DIAGNOSIS — N63 Unspecified lump in unspecified breast: Secondary | ICD-10-CM

## 2022-05-18 DIAGNOSIS — R928 Other abnormal and inconclusive findings on diagnostic imaging of breast: Secondary | ICD-10-CM

## 2022-05-27 ENCOUNTER — Inpatient Hospital Stay: Admission: RE | Admit: 2022-05-27 | Payer: Medicaid Other | Source: Ambulatory Visit

## 2022-05-27 ENCOUNTER — Other Ambulatory Visit: Payer: Medicaid Other

## 2022-06-01 NOTE — Progress Notes (Unsigned)
Bethanie Dicker, NP-C Phone: [phone]  Rita [name] is a 42 y.o. female who presents today for follow up.  Anxiety/Depression- Patient was started on Zoloft 50 mg daily on 04/21/2022. She took the medication for one week then stopped because she did not like the way it made her feel. She reports feeling like her anxiety and depression are situational and she has a lot of daily stressors currently going on. PHQ- 7 and GAD- 4 today. Denies SI/HI.  Prediabetes/Obesity- Patient has been working on dieting. She has cut out bread, pasta and soda. She is not exercising. She is interested in weight loss injections to help with her weight loss.   Ankle Swelling- She has been taking Lasix 20 mg PRN and decreasing the amount of salt in her diet. She has noticed improvement in her swelling.  Social History   Tobacco Use  Smoking Status Never  Smokeless Tobacco Never    Current Outpatient Medications on File Prior to Visit  Medication Sig Dispense Refill   furosemide (LASIX) 20 MG tablet Take 1 tablet (20 mg total) by mouth daily. 30 tablet 2   Vitamin D, Ergocalciferol, (DRISDOL) 1.25 MG (50000 UNIT) CAPS capsule Take 1 capsule (50,000 Units total) by mouth every 7 (seven) days. 13 capsule 1   phentermine (ADIPEX-P) 37.5 MG tablet Take 37.5 mg by mouth daily.     No current facility-administered medications on file prior to visit.    ROS see history of present illness  Objective  Physical Exam Vitals:   06/02/22 0854  BP: 124/84  Pulse: 73  Temp: 99 F (37.2 C)  SpO2: 98%    BP Readings from Last 3 Encounters:  06/02/22 124/84  04/21/22 118/76  03/21/20 122/86   Wt Readings from Last 3 Encounters:  06/02/22 (!) 313 lb (142 kg)  04/21/22 (!) 318 lb (144.2 kg)  03/21/20 300 lb (136.1 kg)    Physical Exam Constitutional:      General: She is not in acute distress.    Appearance: Normal appearance.  HENT:     Head: Normocephalic.  Cardiovascular:     Rate and Rhythm:  Normal rate and regular rhythm.     Heart sounds: Normal heart sounds.  Pulmonary:     Effort: Pulmonary effort is normal.     Breath sounds: Normal breath sounds.  Musculoskeletal:     Right lower leg: No edema.     Left lower leg: No edema.  Skin:    General: Skin is warm and dry.  Neurological:     General: No focal deficit present.     Mental Status: She is alert.  Psychiatric:        Mood and Affect: Mood normal.        Behavior: Behavior normal.    Assessment/Plan: Please see individual problem list.  Depression, recurrent (HCC) Assessment & Plan: No longer taking Zoloft 50 mg. She does not want to try anything new at this time. Discussed with patient decreasing dose to 25 mg daily. She will start taking half of her 50 mg tablet and see if she can tolerate. Will send in 25 mg dose for her to start once finishing her 50 mg tablets. Encouraged to contact if worsening symptoms, unusual behavior changes or suicidal thoughts occur. Will continue to monitor.   Orders: -     Sertraline HCl; Take 1 tablet (25 mg total) by mouth daily.  Dispense: 90 tablet; Refill: 0  Peripheral edema Assessment & Plan: No swelling  on exam today. Patient reports noticing improvement at home with Lasix. Encouraged to only use Lasix PRN. She has been decreasing her salt intake and will continue to do so. She continues to work on diet. Encouraged to start exercising also. Advised adequate fluid intake. Will continue to monitor.   Morbid obesity with BMI of 45.0-49.9, adult [facility]) Assessment & Plan: Will start the patient on Zepbound. Counseled on the risk of pancreatitis and gallbladder disease. Discussed the risk of nausea.  They were advised to discontinue the Zepbound and contact us immediately if they develop abdominal pain. If they develop excessive nausea they will contact us right away. I discussed that medullary thyroid cancer has been seen in rats studies. The patient confirmed no personal or  family history of thyroid cancer, parathyroid cancer, or adrenal gland cancer. Discussed that we thus far have not seen medullary thyroid cancer result from use of this type of medication in humans. Advised to monitor the thyroid area and contact us for any lumps, swelling, trouble swallowing, or any other changes in this area. Discussed goal weight loss of 1 to 2 pounds a week while on this medication. Long discussion with patient regarding importance of lifestyle modifications and continuing to work on healthy diet and exercise even with this medication. Will monitor.  Orders: -     Zepbound; Inject 2.5 mg into the skin once a week. X 4 weeks then increase to 5 mg weekly.  Dispense: 2 mL; Refill: 0 -     Zepbound; Inject 5 mg into the skin once a week. X 4 weeks then increase to 7.5 mg.  Dispense: 2 mL; Refill: 0 -     Zepbound; Inject 7.5 mg into the skin once a week.  Dispense: 2 mL; Refill: 0  Prediabetes -     Zepbound; Inject 2.5 mg into the skin once a week. X 4 weeks then increase to 5 mg weekly.  Dispense: 2 mL; Refill: 0 -     Zepbound; Inject 5 mg into the skin once a week. X 4 weeks then increase to 7.5 mg.  Dispense: 2 mL; Refill: 0 -     Zepbound; Inject 7.5 mg into the skin once a week.  Dispense: 2 mL; Refill: 0    Return in about 3 months (around 09/02/2022) for Follow up.   Bethanie Dicker, NP-C [facility] [employer]

## 2022-06-02 ENCOUNTER — Ambulatory Visit: Payer: 59 | Admitting: Nurse Practitioner

## 2022-06-02 ENCOUNTER — Encounter: Payer: Self-pay | Admitting: Nurse Practitioner

## 2022-06-02 VITALS — BP 124/84 | HR 73 | Temp 99.0°F | Ht 67.0 in | Wt 313.0 lb

## 2022-06-02 DIAGNOSIS — R7303 Prediabetes: Secondary | ICD-10-CM | POA: Diagnosis not present

## 2022-06-02 DIAGNOSIS — R600 Localized edema: Secondary | ICD-10-CM

## 2022-06-02 DIAGNOSIS — F339 Major depressive disorder, recurrent, unspecified: Secondary | ICD-10-CM | POA: Diagnosis not present

## 2022-06-02 DIAGNOSIS — Z6841 Body Mass Index (BMI) 40.0 and over, adult: Secondary | ICD-10-CM

## 2022-06-02 MED ORDER — SERTRALINE HCL 25 MG PO TABS
25.0000 mg | ORAL_TABLET | Freq: Every day | ORAL | 0 refills | Status: DC
Start: 2022-06-02 — End: 2022-10-21

## 2022-06-02 MED ORDER — ZEPBOUND 2.5 MG/0.5ML ~~LOC~~ SOAJ
2.5000 mg | SUBCUTANEOUS | 0 refills | Status: DC
Start: 2022-06-02 — End: 2022-10-21

## 2022-06-02 MED ORDER — ZEPBOUND 7.5 MG/0.5ML ~~LOC~~ SOAJ
7.5000 mg | SUBCUTANEOUS | 0 refills | Status: DC
Start: 2022-06-02 — End: 2022-06-13

## 2022-06-02 MED ORDER — ZEPBOUND 5 MG/0.5ML ~~LOC~~ SOAJ
5.0000 mg | SUBCUTANEOUS | 0 refills | Status: DC
Start: 2022-06-02 — End: 2022-10-21

## 2022-06-02 NOTE — Assessment & Plan Note (Signed)
No swelling on exam today. Patient reports noticing improvement at home with Lasix. Encouraged to only use Lasix PRN. She has been decreasing her salt intake and will continue to do so. She continues to work on diet. Encouraged to start exercising also. Advised adequate fluid intake. Will continue to monitor.

## 2022-06-02 NOTE — Assessment & Plan Note (Addendum)
No longer taking Zoloft 50 mg. She does not want to try anything new at this time. Discussed with patient decreasing dose to 25 mg daily. She will start taking half of her 50 mg tablet and see if she can tolerate. Will send in 25 mg dose for her to start once finishing her 50 mg tablets. Encouraged to contact if worsening symptoms, unusual behavior changes or suicidal thoughts occur. Will continue to monitor.

## 2022-06-02 NOTE — Assessment & Plan Note (Addendum)
Will start the patient on Zepbound. Counseled on the risk of pancreatitis and gallbladder disease. Discussed the risk of nausea.  They were advised to discontinue the Zepbound and contact us immediately if they develop abdominal pain. If they develop excessive nausea they will contact us right away. I discussed that medullary thyroid cancer has been seen in rats studies. The patient confirmed no personal or family history of thyroid cancer, parathyroid cancer, or adrenal gland cancer. Discussed that we thus far have not seen medullary thyroid cancer result from use of this type of medication in humans. Advised to monitor the thyroid area and contact us for any lumps, swelling, trouble swallowing, or any other changes in this area. Discussed goal weight loss of 1 to 2 pounds a week while on this medication. Long discussion with patient regarding importance of lifestyle modifications and continuing to work on healthy diet and exercise even with this medication. Will monitor.

## 2022-06-08 ENCOUNTER — Other Ambulatory Visit (HOSPITAL_COMMUNITY): Payer: Self-pay

## 2022-06-13 ENCOUNTER — Telehealth: Payer: Self-pay

## 2022-06-13 ENCOUNTER — Telehealth: Payer: Self-pay | Admitting: Nurse Practitioner

## 2022-06-13 DIAGNOSIS — R7303 Prediabetes: Secondary | ICD-10-CM

## 2022-06-13 MED ORDER — ZEPBOUND 7.5 MG/0.5ML ~~LOC~~ SOAJ
7.5000 mg | SUBCUTANEOUS | 0 refills | Status: DC
Start: 2022-06-13 — End: 2022-10-21

## 2022-06-13 NOTE — Telephone Encounter (Signed)
Called and spoke with pt in regards to the PA needed for her Zepbound, pt needs to upload her secondary insurance into [place] or bring it by the office so our pharmacy team can run the PA with that insurance, she stated she will try to do one or there other either bring it by or upload through [employer].

## 2022-06-13 NOTE — Telephone Encounter (Signed)
Refill sent in

## 2022-06-13 NOTE — Telephone Encounter (Signed)
Prescription Request  06/13/2022  LOV: 06/02/2022  What is the name of the medication or equipment? tirzepatide (ZEPBOUND) 7.5 MG/0.5ML Pen  Have you contacted your pharmacy to request a refill? Yes   Which pharmacy would you like this sent to?   [id number] [place], [place] - [address] AT [facility] OF [address] [place] [address] Phone: [phone] Fax: [phone]    Patient notified that their request is being sent to the clinical staff for review and that they should receive a response within 2 business days.   Please advise at Mobile [phone] (mobile)

## 2022-06-14 ENCOUNTER — Ambulatory Visit
Admission: RE | Admit: 2022-06-14 | Discharge: 2022-06-14 | Disposition: A | Discharge status: Home / Self Care | Payer: 59 | Source: Ambulatory Visit | Attending: Nurse Practitioner | Admitting: Nurse Practitioner

## 2022-06-14 ENCOUNTER — Ambulatory Visit
Admission: RE | Admit: 2022-06-14 | Discharge: 2022-06-14 | Disposition: A | Discharge status: Home / Self Care | Payer: 59 | Source: Ambulatory Visit | Attending: Nurse Practitioner

## 2022-06-14 DIAGNOSIS — N63 Unspecified lump in unspecified breast: Secondary | ICD-10-CM | POA: Diagnosis present

## 2022-06-14 DIAGNOSIS — R928 Other abnormal and inconclusive findings on diagnostic imaging of breast: Secondary | ICD-10-CM | POA: Diagnosis present

## 2022-06-14 DIAGNOSIS — N6489 Other specified disorders of breast: Secondary | ICD-10-CM

## 2022-06-16 ENCOUNTER — Other Ambulatory Visit: Payer: Self-pay | Admitting: Nurse Practitioner

## 2022-06-16 DIAGNOSIS — Z803 Family history of malignant neoplasm of breast: Secondary | ICD-10-CM | POA: Insufficient documentation

## 2022-06-16 DIAGNOSIS — Z9189 Other specified personal risk factors, not elsewhere classified: Secondary | ICD-10-CM | POA: Insufficient documentation

## 2022-06-17 ENCOUNTER — Telehealth: Payer: Self-pay

## 2022-06-17 ENCOUNTER — Other Ambulatory Visit (HOSPITAL_COMMUNITY): Payer: Self-pay

## 2022-06-17 NOTE — Telephone Encounter (Signed)
Pt called not happy because she still waiting for her med. As per pt, she already upload it a pic of her insurance in her chart. As per pt, the pharmacy below has one box left of this med. Any questions, she's available [phone]

## 2022-06-17 NOTE — Telephone Encounter (Signed)
Pharmacy Patient Advocate Encounter   Received notification that prior authorization for Zepbound 2.5MG /0.5ML pen-injectors is required/requested.   PA submitted on 06/17/22 to (ins) [employer] via CoverMyMeds Key or (Medicaid) confirmation # [id number] Status is pending

## 2022-06-17 NOTE — Telephone Encounter (Signed)
Pt hs been informed via mychart about PA update

## 2022-06-17 NOTE — Telephone Encounter (Signed)
Pharmacy Patient Advocate Encounter   Received notification that prior authorization for Zepbound 2.5MG /0.5ML pen-injectors is required/requested.   PA submitted on 06/17/22 to (ins) [employer] Medicaid via CoverMyMeds Key or (Medicaid) confirmation # [id number] Status is pending

## 2022-06-17 NOTE — Telephone Encounter (Signed)
PA submitted. Created new encounter for PA. Will update and route back to pool once determination has been made.  

## 2022-06-20 NOTE — Telephone Encounter (Signed)
Pharmacy Patient Advocate Encounter  Received notification from [facility] that the request for prior authorization for Zepbound 2.5MG /0.5ML pen-injectors has been denied due to plan exclusion.       Please be advised we currently do not have a Pharmacist to review denials, therefore you will need to process appeals accordingly as needed. Thanks for your support at this time.   You may call [phone] or fax [phone], to appeal.

## 2022-06-21 NOTE — Telephone Encounter (Signed)
Correct, medicaid will not cover any weight loss meds either. Thanks

## 2022-06-21 NOTE — Telephone Encounter (Signed)
Called pt to give update and she wants to confirm that this PA was done using her other insurance and not the medicaid.    I informed pt that usually we would get an auto update stating due to it being medicaid it would not be covered, but I would reach out to our pharmacy team any how.    Just need confirmation.

## 2022-06-22 ENCOUNTER — Other Ambulatory Visit: Payer: Self-pay | Admitting: Nurse Practitioner

## 2022-06-22 NOTE — Telephone Encounter (Signed)
Pt has been informed via mychart

## 2022-07-04 ENCOUNTER — Ambulatory Visit
Admission: RE | Admit: 2022-07-04 | Discharge: 2022-07-04 | Disposition: A | Discharge status: Home / Self Care | Payer: 59 | Source: Ambulatory Visit | Attending: Nurse Practitioner | Admitting: Nurse Practitioner

## 2022-07-04 DIAGNOSIS — Z9189 Other specified personal risk factors, not elsewhere classified: Secondary | ICD-10-CM

## 2022-07-04 DIAGNOSIS — Z803 Family history of malignant neoplasm of breast: Secondary | ICD-10-CM

## 2022-07-04 MED ORDER — GADOPICLENOL 0.5 MMOL/ML IV SOLN
10.0000 mL | Freq: Once | INTRAVENOUS | Status: AC | PRN
  Administered 2022-07-04: 10 mL via INTRAVENOUS

## 2022-07-05 ENCOUNTER — Other Ambulatory Visit: Payer: Self-pay | Admitting: Nurse Practitioner

## 2022-07-05 ENCOUNTER — Telehealth: Payer: Self-pay

## 2022-07-05 ENCOUNTER — Encounter: Payer: Self-pay | Admitting: Licensed Clinical Social Worker

## 2022-07-05 DIAGNOSIS — R9389 Abnormal findings on diagnostic imaging of other specified body structures: Secondary | ICD-10-CM

## 2022-07-05 DIAGNOSIS — N6323 Unspecified lump in the left breast, lower outer quadrant: Secondary | ICD-10-CM | POA: Insufficient documentation

## 2022-07-05 NOTE — Telephone Encounter (Signed)
Incoming fax was received from [facility]    Urgent form that needed to be signed for pt to get breast Biopsied after an abnormal MRI.    Provider Bethanie Dicker, NP has signed the form and form has been faxed back to the given fax number [phone] with a completed transmission log.

## 2022-07-08 ENCOUNTER — Telehealth: Payer: Self-pay

## 2022-07-08 NOTE — Telephone Encounter (Signed)
Spoke with pharmacy to confirm usage of other insurance.  Per pharmacy they have run the united healthcare and it says medication is not covered.  Prior Berkley Harvey has been reprocessed.  Key: [id number]

## 2022-07-11 ENCOUNTER — Ambulatory Visit
Admission: RE | Admit: 2022-07-11 | Discharge: 2022-07-11 | Disposition: A | Discharge status: Home / Self Care | Payer: 59 | Source: Ambulatory Visit | Attending: Nurse Practitioner | Admitting: Nurse Practitioner

## 2022-07-11 DIAGNOSIS — R9389 Abnormal findings on diagnostic imaging of other specified body structures: Secondary | ICD-10-CM

## 2022-07-11 MED ORDER — GADOPICLENOL 0.5 MMOL/ML IV SOLN
10.0000 mL | Freq: Once | INTRAVENOUS | Status: AC | PRN
  Administered 2022-07-11: 10 mL via INTRAVENOUS

## 2022-07-12 ENCOUNTER — Inpatient Hospital Stay: Payer: 59

## 2022-07-12 ENCOUNTER — Inpatient Hospital Stay: Payer: 59 | Attending: Oncology | Admitting: Licensed Clinical Social Worker

## 2022-07-12 ENCOUNTER — Encounter: Payer: Self-pay | Admitting: Licensed Clinical Social Worker

## 2022-07-12 ENCOUNTER — Other Ambulatory Visit: Payer: Self-pay | Admitting: Nurse Practitioner

## 2022-07-12 DIAGNOSIS — Z803 Family history of malignant neoplasm of breast: Secondary | ICD-10-CM | POA: Diagnosis not present

## 2022-07-12 DIAGNOSIS — Z9889 Other specified postprocedural states: Secondary | ICD-10-CM

## 2022-07-12 DIAGNOSIS — Z801 Family history of malignant neoplasm of trachea, bronchus and lung: Secondary | ICD-10-CM

## 2022-07-12 DIAGNOSIS — Z8049 Family history of malignant neoplasm of other genital organs: Secondary | ICD-10-CM | POA: Diagnosis not present

## 2022-07-12 DIAGNOSIS — Z1239 Encounter for other screening for malignant neoplasm of breast: Secondary | ICD-10-CM

## 2022-07-12 NOTE — Progress Notes (Signed)
REFERRING PROVIDER: Bethanie Dicker, NP [address] [place],  [place] [address]  PRIMARY PROVIDER:  Bethanie Dicker, NP  PRIMARY REASON FOR VISIT:  1. FH: breast cancer   2. Family history of uterine cancer   3. Family history of lung cancer      HISTORY OF PRESENT ILLNESS:   Rita [name], a 42 y.o. female, was seen for a [place] cancer genetics consultation at the request of Dr. Konrad Dolores due to a family history of cancer.  Rita [name] presents to clinic today to discuss the possibility of a hereditary predisposition to cancer, genetic testing, and to further clarify her future cancer risks, as well as potential cancer risks for family members.   CANCER HISTORY:  Rita [name] is a 42 y.o. female with no personal history of cancer.    RISK FACTORS:  Menarche was at age 18-11.  First live birth at age 70.  Ovaries intact: yes.  Hysterectomy: no.  Menopausal status: premenopausal.  Colonoscopy: n/a. Mammogram within the last year: yes - results pending. Number of breast biopsies: 1. Up to date with pelvic exams: yes.   Past Medical History:  Diagnosis Date   Blood transfusion without reported diagnosis 09/03/19   Chicken pox    GERD (gastroesophageal reflux disease)     Past Surgical History:  Procedure Laterality Date   CERVICAL CERCLAGE     for childbirth   CERVICAL CERCLAGE N/A 03/18/2019   Procedure: CERCLAGE CERVICAL;  Surgeon: Suzy Bouchard, MD;  Location: [facility] ORS;  Service: Gynecology;  Laterality: N/A;   REPAIR VAGINAL CUFF N/A 09/03/2019   Procedure: REPAIR VAGINAL CUFF;  Surgeon: Schermerhorn, Ihor Austin, MD;  Location: [facility] ORS;  Service: Gynecology;  Laterality: N/A;    FAMILY HISTORY:  We obtained a detailed, 4-generation family history.  Significant diagnoses are listed below: Family History  Problem Relation Age of Onset   Lung cancer Father        d. 65s   Uterine cancer Maternal Aunt 51   Cancer Paternal Aunt        possibly breast, dx 61    Breast cancer Paternal Aunt 39   Stroke Maternal Grandmother    Arthritis Maternal Grandmother    COPD Maternal Grandmother    Heart disease Maternal Grandfather    Breast cancer Paternal Grandmother    Rita [name] has 1 son, 51, 1 daughter, 2. She has 1 maternal half brother and 1 paternal half sister.   Rita [name] maternal aunt had uterine cancer at 14. No other known cancers on her maternal side.  Rita [name] father died of lung cancer in his 61s. A paternal aunt had breast cancer at 48, and another aunt had cancer at 22, unsure exact type but patient thinks may have been breast as well. Paternal grandmother had breast cancer.  Rita [name] is unaware of previous family history of genetic testing for hereditary cancer risks. There is no reported Ashkenazi Jewish ancestry. There is no known consanguinity.    GENETIC COUNSELING ASSESSMENT: Rita [name] is a 42 y.o. female with a family history of breast cancer which is somewhat suggestive of a hereditary cancer syndrome and predisposition to cancer. We, therefore, discussed and recommended the following at today's visit.   DISCUSSION: We discussed that approximately 10% of breast cancer is hereditary. Most cases of hereditary breast cancer are associated with BRCA1/BRCA2 genes, although there are other genes associated with hereditary cancer as well. Cancers and risks are gene specific. We discussed that  testing is beneficial for several reasons including knowing about cancer risks, identifying potential screening and risk-reduction options that may be appropriate, and to understand if other family members could be at risk for cancer and allow them to undergo genetic testing.   We reviewed the characteristics, features and inheritance patterns of hereditary cancer syndromes. We also discussed genetic testing, including the appropriate family members to test, the process of testing, insurance coverage and turn-around-time for results. We discussed  the implications of a negative, positive and/or variant of uncertain significant result. We recommended Ms. Hertenstein pursue genetic testing for the [facility] Multi-Cancer+RNA gene panel.   Based on Rita [name]'s family history of cancer, she meets medical criteria for genetic testing. Despite that she meets criteria, she may still have an out of pocket cost.   PLAN: After considering the risks, benefits, and limitations, Ms. Chaudhary provided informed consent to pursue genetic testing and the blood sample was sent to [facility] for analysis of the Multi-Cancer+RNA panel. Results should be available within approximately 2-3 weeks' time, at which point they will be disclosed by telephone to Rita [name], as will any additional recommendations warranted by these results. Rita [name] will receive a summary of her genetic counseling visit and a copy of her results once available. This information will also be available in Epic.   Rita [name] questions were answered to her satisfaction today. Our contact information was provided should additional questions or concerns arise. Thank you for the referral and allowing Rita [name] to share in the care of your patient.   Lacy Duverney, MS, [facility] Genetic Counselor [email or web address] Phone: [phone]  The patient was seen for a total of 20 minutes in face-to-face genetic counseling.  Dr. Orlie Dakin was available for discussion regarding this case.   _______________________________________________________________________ For Office Staff:  Number of people involved in session: 1 Was an Intern/ student involved with case: no

## 2022-07-14 ENCOUNTER — Ambulatory Visit: Payer: 59 | Admitting: Surgery

## 2022-07-19 ENCOUNTER — Ambulatory Visit (INDEPENDENT_AMBULATORY_CARE_PROVIDER_SITE_OTHER): Payer: 59 | Admitting: Surgery

## 2022-07-19 ENCOUNTER — Encounter: Payer: Self-pay | Admitting: Surgery

## 2022-07-19 VITALS — BP 130/80 | HR 70 | Temp 98.0°F | Ht 66.0 in | Wt 316.0 lb

## 2022-07-19 DIAGNOSIS — N62 Hypertrophy of breast: Secondary | ICD-10-CM

## 2022-07-19 DIAGNOSIS — N6489 Other specified disorders of breast: Secondary | ICD-10-CM

## 2022-07-19 NOTE — Progress Notes (Unsigned)
Patient ID: Rita [name], female   DOB: [birth year], 42 y.o.   MRN: [id number]  Chief Complaint: PASH, left breast.  History of Present Illness Rita [name] is a 42 y.o. female with her first mammogram, leading to an MRI, and MRI guided biopsy.  She uses an IUD for birth control.  She has a family history of breast cancer.  She began menstruating at age of 51.  She is gravida 2 para 2.  She was 14 with her first pregnancy.  She denies any breast pain or nipple discharge or skin changes.  She actually obtain the mammogram as part of initiating a possible breast reduction  Past Medical History Past Medical History:  Diagnosis Date   Blood transfusion without reported diagnosis 09/03/19   Chicken pox    GERD (gastroesophageal reflux disease)       Past Surgical History:  Procedure Laterality Date   CERVICAL CERCLAGE     for childbirth   CERVICAL CERCLAGE N/A 03/18/2019   Procedure: CERCLAGE CERVICAL;  Surgeon: Suzy Bouchard, MD;  Location: [facility] ORS;  Service: Gynecology;  Laterality: N/A;   REPAIR VAGINAL CUFF N/A 09/03/2019   Procedure: REPAIR VAGINAL CUFF;  Surgeon: Schermerhorn, Ihor Austin, MD;  Location: [facility] ORS;  Service: Gynecology;  Laterality: N/A;    Allergies  Allergen Reactions   Metronidazole Rash and Other (See Comments)    Lips swelling     Current Outpatient Medications  Medication Sig Dispense Refill   furosemide (LASIX) 20 MG tablet Take 1 tablet (20 mg total) by mouth daily. 30 tablet 2   levonorgestrel (LILETTA, 52 MG,) 20.1 MCG/DAY IUD IUD 1 each by Intrauterine route once.     phentermine (ADIPEX-P) 37.5 MG tablet Take 37.5 mg by mouth daily.     sertraline (ZOLOFT) 25 MG tablet Take 1 tablet (25 mg total) by mouth daily. 90 tablet 0   Vitamin D, Ergocalciferol, (DRISDOL) 1.25 MG (50000 UNIT) CAPS capsule Take 1 capsule (50,000 Units total) by mouth every 7 (seven) days. 13 capsule 1   tirzepatide (ZEPBOUND) 2.5 MG/0.5ML Pen Inject 2.5 mg into the skin  once a week. X 4 weeks then increase to 5 mg weekly. (Patient not taking: Reported on 07/19/2022) 2 mL 0   tirzepatide (ZEPBOUND) 5 MG/0.5ML Pen Inject 5 mg into the skin once a week. X 4 weeks then increase to 7.5 mg. (Patient not taking: Reported on 07/19/2022) 2 mL 0   tirzepatide (ZEPBOUND) 7.5 MG/0.5ML Pen Inject 7.5 mg into the skin once a week. (Patient not taking: Reported on 07/19/2022) 2 mL 0   No current facility-administered medications for this visit.    Family History Family History  Problem Relation Age of Onset   Lung cancer Father        d. 56s   Throat cancer Sister    Stroke Maternal Grandmother    Arthritis Maternal Grandmother    COPD Maternal Grandmother    Heart disease Maternal Grandfather    Breast cancer Paternal Grandmother    Uterine cancer Maternal Aunt 30   Cancer Paternal Aunt        possibly breast, dx 5   Breast cancer Paternal Aunt 72      Social History Social History   Tobacco Use   Smoking status: Never    Passive exposure: Never   Smokeless tobacco: Never  Vaping Use   Vaping Use: Never used  Substance Use Topics   Alcohol use: Yes    Comment: Occasional (  1 drink 1-2 times/month).   Drug use: No        Review of Systems  Constitutional: Negative.   HENT: Negative.    Eyes: Negative.   Respiratory: Negative.    Cardiovascular: Negative.   Gastrointestinal: Negative.   Genitourinary: Negative.   Skin: Negative.   Neurological: Negative.   Psychiatric/Behavioral: Negative.       Physical Exam Blood pressure 130/80, pulse 70, temperature 98 F (36.7 C), height 5\' 6"  (1.676 m), weight (!) 316 lb (143.3 kg), last menstrual period 05/26/2022, SpO2 97 %, unknown if currently breastfeeding. Last Weight  Most recent update: 07/19/2022  4:09 PM    Weight  143.3 kg (316 lb)               CONSTITUTIONAL: Well developed, and nourished, appropriately responsive and aware without distress.   EYES: Sclera non-icteric.   EARS,  NOSE, MOUTH AND THROAT:  The oropharynx is clear. Oral mucosa is pink and moist.    Hearing is intact to voice.  NECK: Trachea is midline, and there is no jugular venous distension.  LYMPH NODES:  Lymph nodes in the neck are not appreciated. RESPIRATORY:  Normal respiratory effort without pathologic use of accessory muscles. CARDIOVASCULAR: Well perfused.  GI: The abdomen is  soft, nontender, and nondistended.  GU: Caryl Lyn present as chaperone, exam deferred. MUSCULOSKELETAL:  Symmetrical muscle tone appreciated in all four extremities.    SKIN: Skin turgor is normal. No pathologic skin lesions appreciated.  NEUROLOGIC:  Motor and sensation appear grossly normal.  Cranial nerves are grossly without defect. PSYCH:  Alert and oriented to person, place and time. Affect is appropriate for situation.  Data Reviewed I have personally reviewed what is currently available of the patient's imaging, recent labs and medical records.   Labs:     Latest Ref Rng & Units 04/21/2022    1:44 PM 09/05/2019    5:48 AM 09/04/2019    7:10 AM  CBC  WBC 4.0 - 10.5 K/uL 5.0  9.1  6.8   Hemoglobin 12.0 - 15.0 g/dL 96.0  8.9  7.7   Hematocrit 36.0 - 46.0 % 41.6  26.9  23.7   Platelets 150.0 - 400.0 K/uL 184.0  126  115       Latest Ref Rng & Units 04/21/2022    1:44 PM 09/03/2019    9:58 PM 09/03/2019    5:26 AM  CMP  Glucose 70 - 99 mg/dL 454  098  85   BUN 6 - 23 mg/dL 11  <5  <5   Creatinine 0.40 - 1.20 mg/dL 1.19  1.47  8.29   Sodium 135 - 145 mEq/L 139  134  135   Potassium 3.5 - 5.1 mEq/L 4.5 hemolysis present  3.5  3.6   Chloride 96 - 112 mEq/L 104  105  103   CO2 19 - 32 mEq/L 27  22  22    Calcium 8.4 - 10.5 mg/dL 8.9  7.2  8.3   Total Protein 6.0 - 8.3 g/dL 7.3  4.9  6.7   Total Bilirubin 0.2 - 1.2 mg/dL 0.4  0.5  0.6   Alkaline Phos 39 - 117 U/L 66  73  105   AST 0 - 37 U/L 21  27  24    ALT 0 - 35 U/L 12  15  19      Pathology report Breast, left, needle core biopsy, lower outer quadrant,  cylinder clip - FIBROCYSTIC 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 SEE NOTE - NEGATIVE FOR CARCINOMA Diagnosis Note [facility] was notified on 07/12/2022.  Imaging: Radiological images reviewed:  CLINICAL DATA:  42 year old female presents for high risk screening breast MRI. Patient had recent evaluation of likely benign bilateral breast asymmetries.   EXAM: BILATERAL BREAST MRI WITH AND WITHOUT CONTRAST   TECHNIQUE: Multiplanar, multisequence MR images of both breasts were obtained prior to and following the intravenous administration of 10 ml of Vueway   Three-dimensional MR images were rendered by post-processing of the original MR data on an independent workstation. The three-dimensional MR images were interpreted, and findings are reported in the following complete MRI report for this study. Three dimensional images were evaluated at the independent interpreting workstation using the DynaCAD thin client.   COMPARISON:  Recent mammograms.   FINDINGS: Breast composition: c. Heterogeneous fibroglandular tissue.   Background parenchymal enhancement: Moderate.   Right breast: No suspicious mass or enhancement. Small scattered cysts are present.   Left breast: A 0.5 cm enhancing mass is noted within the middle depth LOWER OUTER LEFT breast (image 156: Series 8).   No other suspicious mass or enhancement noted within the LEFT breast. Small scattered cysts are present.   Lymph nodes: No abnormal appearing lymph nodes.   Ancillary findings:  None.   IMPRESSION: 1. Indeterminate 0.5 cm LOWER OUTER LEFT breast mass. MR guided biopsy is recommended. 2. No MR evidence of RIGHT breast malignancy. 3. Small scattered benign cysts within both breasts.   RECOMMENDATION: MR guided LEFT breast biopsy.   BI-RADS CATEGORY  4: Suspicious.     Electronically Signed   By: Harmon Pier M.D.   On: 07/05/2022  07:59 Within last 24 hrs: No results found.  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 Patient Active Problem List   Diagnosis Date Noted   Mass of lower outer quadrant of left breast 07/05/2022   Increased risk of breast cancer 06/16/2022   FH: breast cancer 06/16/2022   Morbid obesity with BMI of 45.0-49.9, adult (HCC) 06/02/2022   Prediabetes 06/02/2022   Vitamin D deficiency 04/22/2022   Peripheral edema 04/21/2022   Depression, recurrent (HCC) 04/21/2022   Back pain 04/20/2015   Preventative health care 04/20/2015    Plan    As there is no evidence that this has grown, would advise continued observation of this area, no additional biopsy or excision is indicated at this time.  Believe she will continue her screening exams per radiology protocol.  Follow-up with [name] as needed.  She would like to pursue consideration of breast reduction, and referral to plastic surgery has been made.  Face-to-face time spent with the patient and accompanying care providers(if present) was 25 minutes, with more than 50% of the time spent counseling, educating, and coordinating care of the patient.    These notes generated with voice recognition software. I apologize for typographical errors.  Campbell Lerner M.D., FACS 07/19/2022, 4:30 PM

## 2022-07-19 NOTE — Patient Instructions (Signed)
We will send a referral to plastic surgery to look at breast reduction surgery. They will call you to set this appointment up.  Have your annual mammogram next year with your primary care provider.    Follow-up with our office as needed.  Please call and ask to speak with a nurse if you develop questions or concerns.

## 2022-07-20 ENCOUNTER — Telehealth: Payer: Self-pay | Admitting: Licensed Clinical Social Worker

## 2022-07-20 DIAGNOSIS — N62 Hypertrophy of breast: Secondary | ICD-10-CM | POA: Insufficient documentation

## 2022-07-20 DIAGNOSIS — N6489 Other specified disorders of breast: Secondary | ICD-10-CM | POA: Insufficient documentation

## 2022-07-27 ENCOUNTER — Other Ambulatory Visit: Payer: Self-pay | Admitting: Nurse Practitioner

## 2022-07-27 DIAGNOSIS — F339 Major depressive disorder, recurrent, unspecified: Secondary | ICD-10-CM

## 2022-07-28 ENCOUNTER — Ambulatory Visit: Payer: Self-pay | Admitting: Licensed Clinical Social Worker

## 2022-07-28 ENCOUNTER — Encounter: Payer: Self-pay | Admitting: Licensed Clinical Social Worker

## 2022-07-28 DIAGNOSIS — Z1379 Encounter for other screening for genetic and chromosomal anomalies: Secondary | ICD-10-CM | POA: Insufficient documentation

## 2022-07-28 NOTE — Telephone Encounter (Signed)
I contacted Rita [name] to discuss her genetic testing results. No pathogenic variants were identified in the 70 genes analyzed. Detailed clinic note to follow.   The test report has been scanned into EPIC and is located under the Molecular Pathology section of the Results Review tab.  A portion of the result report is included below for reference.    Rita Duverney, MS, [facility] Genetic Counselor [name] Phone: [phone]

## 2022-07-28 NOTE — Progress Notes (Signed)
HPI:   Rita [name] was previously seen in the [facility] due to a family history of breast cancer and concerns regarding a hereditary predisposition to cancer. Please refer to our prior cancer genetics clinic note for more information regarding our discussion, assessment and recommendations, at the time. Rita [name] recent genetic test results were disclosed to her, as were recommendations warranted by these results. These results and recommendations are discussed in more detail below.  CANCER HISTORY:  Oncology History   No history exists.    FAMILY HISTORY:  We obtained a detailed, 4-generation family history.  Significant diagnoses are listed below: Family History  Problem Relation Age of Onset   Lung cancer Father        d. 30s   Throat cancer Sister    Stroke Maternal Grandmother    Arthritis Maternal Grandmother    COPD Maternal Grandmother    Heart disease Maternal Grandfather    Breast cancer Paternal Grandmother    Uterine cancer Maternal Aunt 55   Cancer Paternal Aunt        possibly breast, dx 35   Breast cancer Paternal Aunt 57    Rita [name] has 1 son, 61, 1 daughter, 2. She has 1 maternal half brother and 1 paternal half sister.    Rita [name] maternal aunt had uterine cancer at 36. No other known cancers on her maternal side.   Rita [name] father died of lung cancer in his 80s. A paternal aunt had breast cancer at 73, and another aunt had cancer at 2, unsure exact type but patient thinks may have been breast as well. Paternal grandmother had breast cancer.   Rita [name] is unaware of previous family history of genetic testing for hereditary cancer risks. There is no reported Ashkenazi Jewish ancestry. There is no known consanguinity.     GENETIC TEST RESULTS:  The [facility] Multi-Cancer+RNA Panel found no pathogenic mutations.   The Multi-Cancer + RNA Panel offered by [facility] includes sequencing and/or deletion/duplication analysis of the  following 70 genes:  AIP*, ALK, APC*, ATM*, AXIN2*, BAP1*, BARD1*, BLM*, BMPR1A*, BRCA1*, BRCA2*, BRIP1*, CDC73*, CDH1*, CDK4, CDKN1B*, CDKN2A, CHEK2*, CTNNA1*, DICER1*, EPCAM, EGFR, FH*, FLCN*, GREM1, HOXB13, KIT, LZTR1, MAX*, MBD4, MEN1*, MET, MITF, MLH1*, MSH2*, MSH3*, MSH6*, MUTYH*, NF1*, NF2*, NTHL1*, PALB2*, PDGFRA, PMS2*, POLD1*, POLE*, POT1*, PRKAR1A*, PTCH1*, PTEN*, RAD51C*, RAD51D*, RB1*, RET, SDHA*, SDHAF2*, SDHB*, SDHC*, SDHD*, SMAD4*, SMARCA4*, SMARCB1*, SMARCE1*, STK11*, SUFU*, TMEM127*, TP53*, TSC1*, TSC2*, VHL*. RNA analysis is performed for * genes.   The test report has been scanned into EPIC and is located under the Molecular Pathology section of the Results Review tab.  A portion of the result report is included below for reference. Genetic testing reported out on 07/19/2022.     Even though a pathogenic variant was not identified, possible explanations for the cancer in the family may include: There may be no hereditary risk for cancer in the family. The cancers in Rita [name] and/or her family may be sporadic/familial or due to other genetic and environmental factors. There may be a gene mutation in one of these genes that current testing methods cannot detect but that chance is small. There could be another gene that has not yet been discovered, or that we have not yet tested, that is responsible for the cancer diagnoses in the family.  It is also possible there is a hereditary cause for the cancer in the family that Rita [name] did not inherit. Therefore, it is important to  remain in touch with cancer genetics in the future so that we can continue to offer Rita [name] the most up to date genetic testing.   ADDITIONAL GENETIC TESTING:  Rita [name] genetic testing was fairly extensive.  If there are additional relevant genes identified to increase cancer risk that can be analyzed in the future, we would be happy to discuss and coordinate this testing at that time.    CANCER  SCREENING RECOMMENDATIONS:  Rita [name] test result is considered negative (normal).  This means that we have not identified a hereditary cause for her family history of cancer at this time.   An individual's cancer risk and medical management are not determined by genetic test results alone. Overall cancer risk assessment incorporates additional factors, including personal medical history, family history, and any available genetic information that may result in a personalized plan for cancer prevention and surveillance. Therefore, it is recommended she continue to follow the cancer management and screening guidelines provided by her  primary healthcare provider.  Based on the reported personal and family history, specific cancer screenings for Rita [name] and her family include:  Breast Cancer Screening:  The [name] model is one of multiple prediction models developed to estimate an individual's lifetime risk of developing breast cancer. The [name] model is endorsed by the [employer] (NCCN). This model includes many risk factors such as family history, endogenous estrogen exposure, and benign breast disease. The calculation is highly-dependent on the accuracy of clinical data provided by the patient and can change over time. The [name] model may be repeated to reflect new information in her personal or family history in the future.    Rita [name] risk score is 36%.  For women with a greater than 20% lifetime risk of breast cancer, the NCCN recommends the following:    1.   Clinical encounter every 6-12 months to begin when identified as being at increased risk, but not before age 89    2.   Annual mammograms, tomosynthesis is recommended starting 10 years earlier than the youngest breast cancer diagnosis in the family or at age 12 (whichever comes first), but not before age 39     41.   Annual breast MRI starting 10 years earlier than  the youngest breast cancer diagnosis in the family or at age 43 (whichever comes first), but not before age 1   Rita [name] will let us know if she'd like to be referred to the high risk breast clinic.    RECOMMENDATIONS FOR FAMILY MEMBERS:   Since she did not inherit a identifiable mutation in a cancer predisposition gene included on this panel, her children could not have inherited a known mutation from her in one of these genes. Individuals in this family might be at some increased risk of developing cancer, over the general population risk, due to the family history of cancer.  Individuals in the family should notify their providers of the family history of cancer. We recommend women in this family have a yearly mammogram beginning at age 34, or 46 years younger than the earliest onset of cancer, an annual clinical breast exam, and perform monthly breast self-exams.  Family members should have colonoscopies by at age 59, or earlier, as recommended by their providers. Other members of the family may still carry a pathogenic variant in one of these genes that Rita [name] did not inherit. Based on the family history, we recommend those related to her aunt who  had breast cancer at 40/paternal relatives have genetic counseling and testing. Rita [name] will let us know if we can be of any assistance in coordinating genetic counseling and/or testing for this family member.    FOLLOW-UP:  Lastly, we discussed with Rita [name] that cancer genetics is a rapidly advancing field and it is possible that new genetic tests will be appropriate for her and/or her family members in the future. We encouraged her to remain in contact with cancer genetics on an annual basis so we can update her personal and family histories and let her know of advances in cancer genetics that may benefit this family.   Our contact number was provided. Ms. Mansouri questions were answered to her satisfaction, and she knows she is welcome  to call us at anytime with additional questions or concerns.    Lacy Duverney, MS, [facility] Genetic Counselor [email or web address] Phone: [phone]

## 2022-08-03 ENCOUNTER — Other Ambulatory Visit: Payer: Self-pay | Admitting: Nurse Practitioner

## 2022-08-03 DIAGNOSIS — R600 Localized edema: Secondary | ICD-10-CM

## 2022-08-30 ENCOUNTER — Other Ambulatory Visit: Payer: Self-pay | Admitting: Nurse Practitioner

## 2022-08-30 DIAGNOSIS — F339 Major depressive disorder, recurrent, unspecified: Secondary | ICD-10-CM

## 2022-09-06 ENCOUNTER — Ambulatory Visit: Payer: 59 | Admitting: Nurse Practitioner

## 2022-09-13 ENCOUNTER — Ambulatory Visit: Payer: 59 | Admitting: Nurse Practitioner

## 2022-09-13 NOTE — Progress Notes (Deleted)
  Bethanie Dicker, NP-C Phone: [phone]  Rita [name] is a 42 y.o. female who presents today for follow up.   ***  Social History   Tobacco Use  Smoking Status Never   Passive exposure: Never  Smokeless Tobacco Never    Current Outpatient Medications on File Prior to Visit  Medication Sig Dispense Refill   furosemide (LASIX) 20 MG tablet TAKE 1 TABLET(20 MG) BY MOUTH DAILY 30 tablet 2   levonorgestrel (LILETTA, 52 MG,) 20.1 MCG/DAY IUD IUD 1 each by Intrauterine route once.     phentermine (ADIPEX-P) 37.5 MG tablet Take 37.5 mg by mouth daily.     sertraline (ZOLOFT) 25 MG tablet Take 1 tablet (25 mg total) by mouth daily. 90 tablet 0   tirzepatide (ZEPBOUND) 2.5 MG/0.5ML Pen Inject 2.5 mg into the skin once a week. X 4 weeks then increase to 5 mg weekly. (Patient not taking: Reported on 07/19/2022) 2 mL 0   tirzepatide (ZEPBOUND) 5 MG/0.5ML Pen Inject 5 mg into the skin once a week. X 4 weeks then increase to 7.5 mg. (Patient not taking: Reported on 07/19/2022) 2 mL 0   tirzepatide (ZEPBOUND) 7.5 MG/0.5ML Pen Inject 7.5 mg into the skin once a week. (Patient not taking: Reported on 07/19/2022) 2 mL 0   Vitamin D, Ergocalciferol, (DRISDOL) 1.25 MG (50000 UNIT) CAPS capsule Take 1 capsule (50,000 Units total) by mouth every 7 (seven) days. 13 capsule 1   No current facility-administered medications on file prior to visit.     ROS see history of present illness  Objective  Physical Exam There were no vitals filed for this visit.  BP Readings from Last 3 Encounters:  07/19/22 130/80  06/02/22 124/84  04/21/22 118/76   Wt Readings from Last 3 Encounters:  07/19/22 (!) 316 lb (143.3 kg)  06/02/22 (!) 313 lb (142 kg)  04/21/22 (!) 318 lb (144.2 kg)    Physical Exam   Assessment/Plan: Please see individual problem list.  There are no diagnoses linked to this encounter.   Health Maintenance: ***  No follow-ups on file.   Bethanie Dicker, NP-C [facility] -  [employer]

## 2022-09-20 ENCOUNTER — Ambulatory Visit: Payer: 59 | Admitting: Nurse Practitioner

## 2022-10-14 ENCOUNTER — Institutional Professional Consult (permissible substitution): Payer: 59 | Admitting: Plastic Surgery

## 2022-10-20 ENCOUNTER — Encounter: Payer: Self-pay | Admitting: Plastic Surgery

## 2022-10-20 ENCOUNTER — Ambulatory Visit (INDEPENDENT_AMBULATORY_CARE_PROVIDER_SITE_OTHER): Payer: 59 | Admitting: Plastic Surgery

## 2022-10-20 VITALS — BP 149/85 | HR 72 | Ht 66.0 in | Wt 331.0 lb

## 2022-10-20 DIAGNOSIS — M542 Cervicalgia: Secondary | ICD-10-CM | POA: Diagnosis not present

## 2022-10-20 DIAGNOSIS — Z6841 Body Mass Index (BMI) 40.0 and over, adult: Secondary | ICD-10-CM

## 2022-10-20 DIAGNOSIS — M545 Low back pain, unspecified: Secondary | ICD-10-CM | POA: Diagnosis not present

## 2022-10-20 DIAGNOSIS — N62 Hypertrophy of breast: Secondary | ICD-10-CM

## 2022-10-20 DIAGNOSIS — M546 Pain in thoracic spine: Secondary | ICD-10-CM

## 2022-10-20 NOTE — Progress Notes (Signed)
Patient ID: Rita [name], female    DOB: Jun 03, 1980, 42 y.o.   MRN: [id number]   Chief Complaint  Patient presents with   Breast Problem    Mammary Hyperplasia: The patient is a 42 y.o. female with a history of mammary hyperplasia for several years.  She has extremely large breasts causing symptoms that include the following: Back pain in the upper and lower back, including neck pain. She pulls or pins her bra straps to provide better lift and relief of the pressure and pain. She notices relief by holding her breast up manually.  Her shoulder straps cause grooves and pain and pressure that requires padding for relief. Pain medication is sometimes required with motrin and tylenol.  Activities that are hindered by enlarged breasts include: exercise and running.  She has tried supportive clothing as well as fitted bras without improvement.  Her breasts are extremely large and fairly symmetric.  She has hyperpigmentation of the inframammary area on both sides.  The sternal to nipple distance on the right is 43 cm and the left is 43 cm.  The IMF distance is 23 cm.  She is 5 feet 6 inches tall and weighs 331 pounds.  The BMI = 53.4 kg/m.  Preoperative bra size = 40-41H cup.  Mammogram history: 2024 and had a bx which was negative.  Stong family history of Breast Ca.  Family history of breast cancer:  yes.  Tobacco use:  no.   The patient expresses the desire to pursue surgical intervention.     Review of Systems  Constitutional:  Positive for activity change. Negative for appetite change.  Eyes: Negative.   Respiratory: Negative.  Negative for chest tightness.   Cardiovascular: Negative.   Gastrointestinal: Negative.   Endocrine: Negative.   Genitourinary: Negative.   Musculoskeletal:  Positive for back pain and neck pain.  Skin:  Positive for rash.  Hematological: Negative.     Past Medical History:  Diagnosis Date   Blood transfusion without reported diagnosis 09/03/19   Chicken  pox    GERD (gastroesophageal reflux disease)     Past Surgical History:  Procedure Laterality Date   CERVICAL CERCLAGE     for childbirth   CERVICAL CERCLAGE N/A 03/18/2019   Procedure: CERCLAGE CERVICAL;  Surgeon: Suzy Bouchard, MD;  Location: [facility] ORS;  Service: Gynecology;  Laterality: N/A;   REPAIR VAGINAL CUFF N/A 09/03/2019   Procedure: REPAIR VAGINAL CUFF;  Surgeon: Schermerhorn, Ihor Austin, MD;  Location: [facility] ORS;  Service: Gynecology;  Laterality: N/A;      Current Outpatient Medications:    furosemide (LASIX) 20 MG tablet, TAKE 1 TABLET(20 MG) BY MOUTH DAILY, Disp: 30 tablet, Rfl: 2   levonorgestrel (LILETTA, 52 MG,) 20.1 MCG/DAY IUD IUD, 1 each by Intrauterine route once., Disp: , Rfl:    phentermine (ADIPEX-P) 37.5 MG tablet, Take 37.5 mg by mouth daily., Disp: , Rfl:    sertraline (ZOLOFT) 25 MG tablet, Take 1 tablet (25 mg total) by mouth daily., Disp: 90 tablet, Rfl: 0   tirzepatide (ZEPBOUND) 2.5 MG/0.5ML Pen, Inject 2.5 mg into the skin once a week. X 4 weeks then increase to 5 mg weekly. (Patient not taking: Reported on 07/19/2022), Disp: 2 mL, Rfl: 0   tirzepatide (ZEPBOUND) 5 MG/0.5ML Pen, Inject 5 mg into the skin once a week. X 4 weeks then increase to 7.5 mg. (Patient not taking: Reported on 07/19/2022), Disp: 2 mL, Rfl: 0   tirzepatide (ZEPBOUND) 7.5 MG/0.5ML  Pen, Inject 7.5 mg into the skin once a week. (Patient not taking: Reported on 07/19/2022), Disp: 2 mL, Rfl: 0   Vitamin D, Ergocalciferol, (DRISDOL) 1.25 MG (50000 UNIT) CAPS capsule, Take 1 capsule (50,000 Units total) by mouth every 7 (seven) days., Disp: 13 capsule, Rfl: 1   Objective:   Vitals:   10/20/22 1439  BP: (!) 149/85  Pulse: 72  SpO2: 98%    Physical Exam Vitals reviewed.  Constitutional:      Appearance: Normal appearance.  HENT:     Head: Normocephalic and atraumatic.  Cardiovascular:     Rate and Rhythm: Normal rate.     Pulses: Normal pulses.  Pulmonary:     Effort:  Pulmonary effort is normal.  Abdominal:     General: There is no distension.     Palpations: Abdomen is soft.     Tenderness: There is no abdominal tenderness.  Skin:    General: Skin is warm.     Capillary Refill: Capillary refill takes less than 2 seconds.  Neurological:     Mental Status: She is alert and oriented to person, place, and time.  Psychiatric:        Mood and Affect: Mood normal.        Behavior: Behavior normal.        Thought Content: Thought content normal.        Judgment: Judgment normal.     Assessment & Plan:  Morbid obesity with BMI of 45.0-49.9, adult (HCC)  Symptomatic mammary hypertrophy  Chronic bilateral low back pain without sciatica  Neck pain  The procedure the patient selected and that was best for the patient was discussed. The risk were discussed and include but not limited to the following:  Breast asymmetry, fluid accumulation, firmness of the breast, inability to breast feed, loss of nipple or areola, skin loss, change in skin and nipple sensation, fat necrosis of the breast tissue, bleeding, infection and healing delay.  There are risks of anesthesia and injury to nerves or blood vessels.  Allergic reaction to tape, suture and skin glue are possible.  There will be swelling.  Any of these can lead to the need for revisional surgery which is not included in this surgery.  A breast reduction has potential to interfere with diagnostic procedures in the future.  This procedure is best done when the breast is fully developed.  Changes in the breast will continue to occur over time: pregnancy, weight gain or weigh loss. No guarantees are given for a certain bra or breast size.    Total time: 40 minutes. This includes time spent with the patient during the visit as well as time spent before and after the visit reviewing the chart, documenting the encounter, ordering pertinent studies and literature for the patient.    The patient is planning on undergoing  assessment for gastric bypass.  I strongly recommend that she go through that process and get to her ideal weight or close to her ideal weight prior to breast reduction.  I explained to the patient why this is important and she agrees.  We will plan to see her back in about a year and she was given an appointment.  Pictures were obtained of the patient and placed in the chart with the patient's or guardian's permission.   Alena Bills Rainn Bullinger, DO

## 2022-10-21 ENCOUNTER — Telehealth: Payer: Self-pay

## 2022-10-21 ENCOUNTER — Ambulatory Visit (INDEPENDENT_AMBULATORY_CARE_PROVIDER_SITE_OTHER): Payer: 59 | Admitting: Nurse Practitioner

## 2022-10-21 ENCOUNTER — Encounter: Payer: Self-pay | Admitting: Nurse Practitioner

## 2022-10-21 VITALS — BP 128/68 | HR 67 | Temp 98.9°F | Ht 66.0 in | Wt 333.0 lb

## 2022-10-21 DIAGNOSIS — Z6841 Body Mass Index (BMI) 40.0 and over, adult: Secondary | ICD-10-CM

## 2022-10-21 NOTE — Telephone Encounter (Signed)
Pt presented today for her appt and needed a form filled out and sent to [facility] health care at fax number [phone] provider signed and form was faxed with a completed transmission log

## 2022-10-21 NOTE — Progress Notes (Signed)
  Bethanie Dicker, NP-C Phone: [phone]  Rita [name] is a 42 y.o. female who presents today for surgery information.  Patient has been pursuing the Duodenal Switch surgical procedure for weight loss. She has multiple steps and clearances she must go through prior to her case being presented for insurance approval. She has been going to nutrition classes monthly. She had a psychiatric/psychological clearance earlier this week. She is hoping to have her surgery completed by the end of this year. Today she is needing a form filled out with at least 5 years of her weight history to turn into her weight loss surgeon. She has tried multiple different diets and medications for weight loss all without lasting results. Her goal is to lose at least 100 pounds.  Social History   Tobacco Use  Smoking Status Never   Passive exposure: Never  Smokeless Tobacco Never    Current Outpatient Medications on File Prior to Visit  Medication Sig Dispense Refill   furosemide (LASIX) 20 MG tablet TAKE 1 TABLET(20 MG) BY MOUTH DAILY 30 tablet 2   levonorgestrel (LILETTA, 52 MG,) 20.1 MCG/DAY IUD IUD 1 each by Intrauterine route once.     No current facility-administered medications on file prior to visit.     ROS see history of present illness  Objective  Physical Exam Vitals:   10/21/22 1515  BP: 128/68  Pulse: 67  Temp: 98.9 F (37.2 C)  SpO2: 99%    BP Readings from Last 3 Encounters:  10/21/22 128/68  10/20/22 (!) 149/85  07/19/22 130/80   Wt Readings from Last 3 Encounters:  10/21/22 (!) 333 lb (151 kg)  10/20/22 (!) 331 lb (150.1 kg)  07/19/22 (!) 316 lb (143.3 kg)    Physical Exam Constitutional:      General: She is not in acute distress.    Appearance: Normal appearance. She is obese.  HENT:     Head: Normocephalic.  Cardiovascular:     Rate and Rhythm: Normal rate and regular rhythm.     Heart sounds: Normal heart sounds.  Pulmonary:     Effort: Pulmonary effort is  normal.     Breath sounds: Normal breath sounds.  Skin:    General: Skin is warm and dry.  Neurological:     General: No focal deficit present.     Mental Status: She is alert.  Psychiatric:        Mood and Affect: Mood normal.        Behavior: Behavior normal.    Assessment/Plan: Please see individual problem list.  Morbid obesity with BMI of 50.0-59.9, adult [facility]) Assessment & Plan: Seeking Duodenal Switch surgical procedure for weight loss. 5 year weight history provided to patient, paperwork signed. She will continue working on her clearances for surgery and attending nutrition classes. Advised to contact if she needs anything else prior to surgery. She will follow up as needed or in 6 months for her annual exam.     Return in about 6 months (around 04/20/2023) for Annual Exam, sooner PRN.   Bethanie Dicker, NP-C [facility] - [employer]

## 2022-10-21 NOTE — Assessment & Plan Note (Addendum)
Seeking Duodenal Switch surgical procedure for weight loss. 5 year weight history provided to patient, paperwork signed. She will continue working on her clearances for surgery and attending nutrition classes. Advised to contact if she needs anything else prior to surgery. She will follow up as needed or in 6 months for her annual exam.

## 2022-11-16 ENCOUNTER — Other Ambulatory Visit: Payer: Self-pay | Admitting: Nurse Practitioner

## 2022-11-16 ENCOUNTER — Encounter: Payer: Self-pay | Admitting: Nurse Practitioner

## 2022-11-16 DIAGNOSIS — N6489 Other specified disorders of breast: Secondary | ICD-10-CM

## 2022-11-16 DIAGNOSIS — R928 Other abnormal and inconclusive findings on diagnostic imaging of breast: Secondary | ICD-10-CM

## 2022-12-16 ENCOUNTER — Other Ambulatory Visit: Payer: 59

## 2022-12-16 ENCOUNTER — Inpatient Hospital Stay: Admission: RE | Admit: 2022-12-16 | Payer: 59 | Source: Ambulatory Visit

## 2023-01-04 ENCOUNTER — Other Ambulatory Visit: Payer: 59

## 2023-01-14 ENCOUNTER — Other Ambulatory Visit: Payer: 59

## 2023-06-27 ENCOUNTER — Encounter: Payer: Self-pay | Admitting: Nurse Practitioner

## 2023-06-27 ENCOUNTER — Ambulatory Visit (INDEPENDENT_AMBULATORY_CARE_PROVIDER_SITE_OTHER): Payer: Self-pay | Admitting: Nurse Practitioner

## 2023-06-27 VITALS — BP 122/84 | Temp 98.5°F | Ht 66.0 in | Wt 319.6 lb

## 2023-06-27 DIAGNOSIS — L659 Nonscarring hair loss, unspecified: Secondary | ICD-10-CM

## 2023-06-27 DIAGNOSIS — Z6841 Body Mass Index (BMI) 40.0 and over, adult: Secondary | ICD-10-CM

## 2023-06-27 DIAGNOSIS — R7303 Prediabetes: Secondary | ICD-10-CM

## 2023-06-27 DIAGNOSIS — E559 Vitamin D deficiency, unspecified: Secondary | ICD-10-CM

## 2023-06-27 DIAGNOSIS — R600 Localized edema: Secondary | ICD-10-CM

## 2023-06-27 MED ORDER — ZEPBOUND 2.5 MG/0.5ML ~~LOC~~ SOAJ
2.5000 mg | SUBCUTANEOUS | 0 refills | Status: DC
Start: 2023-06-27 — End: 2023-08-08

## 2023-06-27 MED ORDER — FUROSEMIDE 20 MG PO TABS
20.0000 mg | ORAL_TABLET | Freq: Every day | ORAL | 2 refills | Status: DC | PRN
Start: 2023-06-27 — End: 2023-12-22

## 2023-06-27 MED ORDER — ZEPBOUND 5 MG/0.5ML ~~LOC~~ SOAJ
5.0000 mg | SUBCUTANEOUS | 0 refills | Status: DC
Start: 2023-06-27 — End: 2023-08-08

## 2023-06-27 NOTE — Progress Notes (Unsigned)
 Rita Burkitt, NP-C Phone: [phone]  Rita [name] is a 43 y.o. female who presents today for hair loss.   Discussed the use of AI scribe software for clinical note transcription with the patient, who gave verbal consent to proceed.  History of Present Illness   Rita [name] is a 43 year old female who presents with hair loss and weight management concerns.  She experiences significant hair loss, leaving almost a handful of hair after washing it a couple of times a week. Her mother has noticed her scalp is visible through her hair at the top. She has been taking prenatal vitamins and changed hair products without improvement. Salon treatments over the past year have shown minimal improvement. She does not dye her hair and notes worsening of the condition recently.  She has unwanted facial hair growth on her chin, describing it as 'like a whole man right now.' Waxing has been ineffective due to skin irritation, and she is considering laser hair removal.  She reports ankle swelling, which previously improved with weight loss and Lasix  use. She discontinued Lasix  when the swelling subsided but has noticed its return despite recent weight loss. She attributes her weight loss to using her husband's Ozempic, initially causing sickness but improving after a few days. She attempted weight loss surgery in the past but was unable to proceed due to insurance coverage issues, leading to frustration with medical expenses.  She has a family history of thyroid  issues, with her sister affected. She is concerned about diabetes, as her husband is diabetic, and she fears developing it herself. Her vitamin D  was previously low, and she takes a high dose once a week.  She is supposed to have mammograms every six months due to previous findings but has been inconsistent due to financial constraints.      Social History   Tobacco Use  Smoking Status Never   Passive exposure: Never  Smokeless Tobacco Never     Current Outpatient Medications on File Prior to Visit  Medication Sig Dispense Refill   levonorgestrel  (LILETTA , 52 MG,) 20.1 MCG/DAY IUD IUD 1 each by Intrauterine route once.     No current facility-administered medications on file prior to visit.     ROS see history of present illness  Objective  Physical Exam Vitals:   06/27/23 1517  BP: 122/84  Temp: 98.5 F (36.9 C)    BP Readings from Last 3 Encounters:  06/27/23 122/84  10/21/22 128/68  10/20/22 (!) 149/85   Wt Readings from Last 3 Encounters:  06/27/23 (!) 319 lb 9.6 oz (145 kg)  10/21/22 (!) 333 lb (151 kg)  10/20/22 (!) 331 lb (150.1 kg)    Physical Exam Constitutional:      General: She is not in acute distress.    Appearance: Normal appearance. She is obese.  HENT:     Head: Normocephalic.  Cardiovascular:     Rate and Rhythm: Normal rate and regular rhythm.     Heart sounds: Normal heart sounds.  Pulmonary:     Effort: Pulmonary effort is normal.     Breath sounds: Normal breath sounds.  Skin:    General: Skin is warm and dry.  Neurological:     General: No focal deficit present.     Mental Status: She is alert.  Psychiatric:        Mood and Affect: Mood normal.        Behavior: Behavior normal.      Assessment/Plan: Please see  individual problem list.  Hair loss Assessment & Plan: She has progressive hair loss with increased scalp visibility. Differential diagnosis includes thyroid  dysfunction, vitamin deficiencies, or age-related changes. Check lab work as outlined. Continue prenatal or multivitamin supplementation.  Orders: -     CBC with Differential/Platelet -     TSH -     IBC + Ferritin -     Vitamin B12  Morbid obesity with BMI of 50.0-59.9, adult [facility]) Assessment & Plan: She has morbid obesity with a history of an unsuccessful bariatric surgery attempt. Recent weight loss is partially attributed to unauthorized Ozempic use. Zepbound  is considered as a potential option  pending insurance approval. Emphasized the importance of lifestyle changes for sustainable weight loss. Counseled on the risk of pancreatitis and gallbladder disease. Discussed the risk of nausea. Advised to discontinue the Zepbound  and contact us  immediately if they develop abdominal pain. If they develop excessive nausea they will contact us  right away. I discussed that medullary thyroid  cancer has been seen in rats studies. The patient confirmed no personal or family history of thyroid  cancer, parathyroid cancer, or adrenal gland cancer. Discussed that we thus far have not seen medullary thyroid  cancer result from use of this type of medication in humans. Advised to monitor the thyroid  area and contact us  for any lumps, swelling, trouble swallowing, or any other changes in this area.  Discussed goal weight loss of 1 to 2 pounds a week while on this medication. Discussed the importance of healthy diet, exercise and lifestyle modifications even with this medication. Check lab work as outlined. Advise a low-carb, high-protein diet and encourage regular exercise. Schedule follow-up in 6 weeks to assess response to Zepbound .  Orders: -     Insulin, Free (Bioactive) -     Zepbound ; Inject 2.5 mg into the skin once a week.  Dispense: 2 mL; Refill: 0 -     Zepbound ; Inject 5 mg into the skin once a week.  Dispense: 2 mL; Refill: 0  Prediabetes Assessment & Plan: See plan for morbid obesity. Starting Zepbound . Check A1c today. Encourage healthy diet and regular exercise.   Orders: -     Hemoglobin A1c -     Insulin, Free (Bioactive) -     Zepbound ; Inject 2.5 mg into the skin once a week.  Dispense: 2 mL; Refill: 0 -     Zepbound ; Inject 5 mg into the skin once a week.  Dispense: 2 mL; Refill: 0  Peripheral edema Assessment & Plan: She experiences recurrent ankle edema after discontinuing Lasix  despite weight loss. Refill Lasix  PRN. Encourage decreased sodium intake, healthy diet and regular exercise.    Orders: -     Comprehensive metabolic panel with GFR -     Furosemide ; Take 1 tablet (20 mg total) by mouth daily as needed for edema.  Dispense: 30 tablet; Refill: 2  Vitamin D  deficiency -     VITAMIN D  25 Hydroxy (Vit-D Deficiency, Fractures)  Other orders -     EXTRA LAV TOP TUBE      Return in about 6 weeks (around 08/08/2023) for Follow up.   Rita Burkitt, NP-C [facility] - [facility]

## 2023-06-28 LAB — CBC WITH DIFFERENTIAL/PLATELET
Basophils Absolute: 0 10*3/uL (ref 0.0–0.1)
Basophils Relative: 0.9 % (ref 0.0–3.0)
Eosinophils Absolute: 0.1 10*3/uL (ref 0.0–0.7)
Eosinophils Relative: 3.2 % (ref 0.0–5.0)
HCT: 42.4 % (ref 36.0–46.0)
Hemoglobin: 13.5 g/dL (ref 12.0–15.0)
Lymphocytes Relative: 32.6 % (ref 12.0–46.0)
Lymphs Abs: 1.5 10*3/uL (ref 0.7–4.0)
MCHC: 31.9 g/dL (ref 30.0–36.0)
MCV: 77.6 fl — ABNORMAL LOW (ref 78.0–100.0)
Monocytes Absolute: 0.4 10*3/uL (ref 0.1–1.0)
Monocytes Relative: 8.7 % (ref 3.0–12.0)
Neutro Abs: 2.5 10*3/uL (ref 1.4–7.7)
Neutrophils Relative %: 54.6 % (ref 43.0–77.0)
Platelets: 263 10*3/uL (ref 150.0–400.0)
RBC: 5.46 Mil/uL — ABNORMAL HIGH (ref 3.87–5.11)
RDW: 13.7 % (ref 11.5–15.5)
WBC: 4.6 10*3/uL (ref 4.0–10.5)

## 2023-06-28 LAB — COMPREHENSIVE METABOLIC PANEL WITH GFR
ALT: 15 U/L (ref 0–35)
AST: 13 U/L (ref 0–37)
Albumin: 4.2 g/dL (ref 3.5–5.2)
Alkaline Phosphatase: 65 U/L (ref 39–117)
BUN: 9 mg/dL (ref 6–23)
CO2: 30 meq/L (ref 19–32)
Calcium: 9.3 mg/dL (ref 8.4–10.5)
Chloride: 103 meq/L (ref 96–112)
Creatinine, Ser: 0.57 mg/dL (ref 0.40–1.20)
GFR: 112.73 mL/min (ref >60.00)
Glucose, Bld: 82 mg/dL (ref 70–99)
Potassium: 4.3 meq/L (ref 3.5–5.1)
Sodium: 140 meq/L (ref 135–145)
Total Bilirubin: 0.3 mg/dL (ref 0.2–1.2)
Total Protein: 7.3 g/dL (ref 6.0–8.3)

## 2023-06-28 LAB — VITAMIN D 25 HYDROXY (VIT D DEFICIENCY, FRACTURES): VITD: 16.69 ng/mL — ABNORMAL LOW (ref 30.00–100.00)

## 2023-06-28 LAB — IBC + FERRITIN
Ferritin: 77.6 ng/mL (ref 10.0–291.0)
Iron: 56 ug/dL (ref 42–145)
Saturation Ratios: 14 % — ABNORMAL LOW (ref 20.0–50.0)
TIBC: 399 ug/dL (ref 250.0–450.0)
Transferrin: 285 mg/dL (ref 212.0–360.0)

## 2023-06-28 LAB — VITAMIN B12: Vitamin B-12: 241 pg/mL (ref 211–911)

## 2023-06-28 LAB — HEMOGLOBIN A1C: Hgb A1c MFr Bld: 5.5 % (ref 4.6–6.5)

## 2023-06-28 LAB — TSH: TSH: 1.34 u[IU]/mL (ref 0.35–5.50)

## 2023-06-30 ENCOUNTER — Telehealth: Payer: Self-pay

## 2023-06-30 ENCOUNTER — Other Ambulatory Visit (HOSPITAL_COMMUNITY): Payer: Self-pay

## 2023-06-30 DIAGNOSIS — L659 Nonscarring hair loss, unspecified: Secondary | ICD-10-CM | POA: Insufficient documentation

## 2023-06-30 LAB — EXTRA LAV TOP TUBE

## 2023-06-30 LAB — INSULIN, FREE (BIOACTIVE)

## 2023-06-30 NOTE — Assessment & Plan Note (Signed)
 She experiences recurrent ankle edema after discontinuing Lasix  despite weight loss. Refill Lasix  PRN. Encourage decreased sodium intake, healthy diet and regular exercise.

## 2023-06-30 NOTE — Assessment & Plan Note (Signed)
 See plan for morbid obesity. Starting Zepbound . Check A1c today. Encourage healthy diet and regular exercise.

## 2023-06-30 NOTE — Assessment & Plan Note (Signed)
 She has progressive hair loss with increased scalp visibility. Differential diagnosis includes thyroid  dysfunction, vitamin deficiencies, or age-related changes. Check lab work as outlined. Continue prenatal or multivitamin supplementation.

## 2023-06-30 NOTE — Assessment & Plan Note (Signed)
 She has morbid obesity with a history of an unsuccessful bariatric surgery attempt. Recent weight loss is partially attributed to unauthorized Ozempic use. Zepbound  is considered as a potential option pending insurance approval. Emphasized the importance of lifestyle changes for sustainable weight loss. Counseled on the risk of pancreatitis and gallbladder disease. Discussed the risk of nausea. Advised to discontinue the Zepbound  and contact us  immediately if they develop abdominal pain. If they develop excessive nausea they will contact us  right away. I discussed that medullary thyroid  cancer has been seen in rats studies. The patient confirmed no personal or family history of thyroid  cancer, parathyroid cancer, or adrenal gland cancer. Discussed that we thus far have not seen medullary thyroid  cancer result from use of this type of medication in humans. Advised to monitor the thyroid  area and contact us  for any lumps, swelling, trouble swallowing, or any other changes in this area.  Discussed goal weight loss of 1 to 2 pounds a week while on this medication. Discussed the importance of healthy diet, exercise and lifestyle modifications even with this medication. Check lab work as outlined. Advise a low-carb, high-protein diet and encourage regular exercise. Schedule follow-up in 6 weeks to assess response to Zepbound .

## 2023-06-30 NOTE — Telephone Encounter (Signed)
 Pharmacy Patient Advocate Encounter   Received notification from Onbase that prior authorization for Zepbound  2.5 mg/0.5 ml pen is required/requested.   Insurance verification completed.   The patient is insured through [place] .   Per test claim: Non formulary drug

## 2023-07-04 ENCOUNTER — Ambulatory Visit: Payer: Self-pay | Admitting: Nurse Practitioner

## 2023-07-05 ENCOUNTER — Other Ambulatory Visit: Payer: Self-pay | Admitting: Nurse Practitioner

## 2023-07-05 DIAGNOSIS — E611 Iron deficiency: Secondary | ICD-10-CM

## 2023-07-05 DIAGNOSIS — R7303 Prediabetes: Secondary | ICD-10-CM

## 2023-07-05 DIAGNOSIS — E559 Vitamin D deficiency, unspecified: Secondary | ICD-10-CM

## 2023-07-05 MED ORDER — IRON (FERROUS SULFATE) 325 (65 FE) MG PO TABS
1.0000 | ORAL_TABLET | Freq: Every day | ORAL | 0 refills | Status: AC
Start: 2023-07-05 — End: ?

## 2023-07-05 MED ORDER — VITAMIN D (ERGOCALCIFEROL) 1.25 MG (50000 UNIT) PO CAPS
50000.0000 [IU] | ORAL_CAPSULE | ORAL | 1 refills | Status: AC
Start: 2023-07-05 — End: ?

## 2023-08-08 ENCOUNTER — Telehealth: Payer: Self-pay

## 2023-08-08 ENCOUNTER — Ambulatory Visit: Payer: Self-pay | Admitting: Nurse Practitioner

## 2023-08-08 VITALS — BP 126/72 | Temp 98.5°F | Ht 66.0 in | Wt 325.2 lb

## 2023-08-08 DIAGNOSIS — E611 Iron deficiency: Secondary | ICD-10-CM | POA: Diagnosis not present

## 2023-08-08 DIAGNOSIS — F339 Major depressive disorder, recurrent, unspecified: Secondary | ICD-10-CM

## 2023-08-08 DIAGNOSIS — E559 Vitamin D deficiency, unspecified: Secondary | ICD-10-CM | POA: Diagnosis not present

## 2023-08-08 DIAGNOSIS — R7303 Prediabetes: Secondary | ICD-10-CM

## 2023-08-08 DIAGNOSIS — R600 Localized edema: Secondary | ICD-10-CM

## 2023-08-08 DIAGNOSIS — Z6841 Body Mass Index (BMI) 40.0 and over, adult: Secondary | ICD-10-CM

## 2023-08-08 MED ORDER — WEGOVY 0.25 MG/0.5ML ~~LOC~~ SOAJ
0.2500 mg | SUBCUTANEOUS | 0 refills | Status: DC
Start: 2023-08-08 — End: 2023-08-10

## 2023-08-08 MED ORDER — SERTRALINE HCL 25 MG PO TABS
25.0000 mg | ORAL_TABLET | Freq: Every day | ORAL | Status: DC
Start: 2023-08-08 — End: 2023-09-20

## 2023-08-08 MED ORDER — WEGOVY 0.5 MG/0.5ML ~~LOC~~ SOAJ
0.5000 mg | SUBCUTANEOUS | 0 refills | Status: DC
Start: 2023-08-08 — End: 2023-08-10

## 2023-08-08 NOTE — Telephone Encounter (Signed)
 PA needed for [facility].

## 2023-08-08 NOTE — Progress Notes (Signed)
 Leron Glance, NP-C Phone: [phone]  Rita [name] is a 43 y.o. female who presents today for follow up.   Discussed the use of AI scribe software for clinical note transcription with the patient, who gave verbal consent to proceed.  History of Present Illness   Rita [name] is a 43 year old female who presents with weight gain and swelling in the legs.  She has experienced a weight gain of six pounds since her last visit, which she attributes to a period of inactivity and poor diet while caring for her sick brother in [place]. 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 Despite feeling like she was losing weight, the scale showed otherwise.  She has significant swelling in her ankles, described as being so tight that they almost hurt. This swelling occurs within 30 minutes of waking and placing her feet on the ground. She has been taking two Lasix  pills daily for four to five days, which alleviated the tightness but did not reduce the swelling. The swelling is less pronounced when she elevates her legs overnight.  Her current medications include an iron  supplement, which she takes inconsistently, and vitamin D , which she mistakenly took daily instead of weekly. Additionally, she has been using sertraline  (Zoloft ) 25 mg on a PRN basis, which she finds helps her feel more relaxed.  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  She has a history of prediabetes and is concerned about the potential progression to diabetes.  Social History   Tobacco Use  Smoking Status Never   Passive exposure: Never  Smokeless Tobacco Never    Current Outpatient Medications on File Prior to Visit   Medication Sig Dispense Refill   furosemide  (LASIX ) 20 MG tablet Take 1 tablet (20 mg total) by mouth daily as needed for edema. 30 tablet 2   Iron , Ferrous Sulfate , 325 (65 Fe) MG TABS Take 1 tablet by mouth daily. 90 tablet 0   Vitamin D , Ergocalciferol , (DRISDOL ) 1.25 MG (50000 UNIT) CAPS capsule Take 1 capsule (50,000 Units total) by mouth every 7 (seven) days. 13 capsule 1   levonorgestrel  (LILETTA , 52 MG,) 20.1 MCG/DAY IUD IUD 1 each by Intrauterine route once.     No current facility-administered medications on file prior to visit.     ROS see history of present illness  Objective  Physical Exam Vitals:   08/08/23 1441  BP: 126/72  Temp: 98.5 F (36.9 C)    BP Readings from Last 3 Encounters:  08/08/23 126/72  06/27/23 122/84  10/21/22 128/68   Wt Readings from Last 3 Encounters:  08/08/23 (!) 325 lb 3.2 oz (147.5 kg)  06/27/23 (!) 319 lb 9.6 oz (145 kg)  10/21/22 (!) 333 lb (151 kg)    Physical Exam Constitutional:      General: She is not in acute distress.    Appearance: Normal appearance.  HENT:     Head: Normocephalic.  Cardiovascular:     Rate and Rhythm: Normal rate and regular rhythm.     Heart sounds: Normal heart sounds.  Pulmonary:     Effort: Pulmonary effort is normal.     Breath sounds: Normal breath sounds.  Musculoskeletal:     Right lower leg: 1+ Edema present.  Left lower leg: 1+ Edema present.  Skin:    General: Skin is warm and dry.  Neurological:     General: No focal deficit present.     Mental Status: She is alert.  Psychiatric:        Mood and Affect: Mood normal.        Behavior: Behavior normal.      Assessment/Plan: Please see individual problem list.  Morbid obesity with BMI of 50.0-59.9, adult (HCC) Assessment & Plan: Weight gain is attributed to stress and poor diet, with previous weight loss medications hindered by insurance. She is motivated to lose weight with her brother's support. Check insurance denial for  Zepbound  and explore cost options. Trial Wegovy  if insurance covers it. Discuss dietary modifications to reduce calorie intake and improve portion control. Consider Contrave  if insurance does not cover Wegovy .    Peripheral edema Assessment & Plan: Persistent ankle swelling is possibly due to high salt intake and weight gain. An echocardiogram is planned to rule out cardiac causes. Order echocardiogram to assess cardiac function and advise reducing salt intake. Encourage weight loss.   Orders: -     ECHOCARDIOGRAM COMPLETE; Future  Depression, recurrent (HCC) Assessment & Plan: Sertraline  taken PRN is ineffective. Explained the need for consistent daily use and addressed stigma concerns. Advise taking sertraline  25 mg daily and reassure about safety and purpose. Plan to reassess mood and medication effectiveness in future visit. Counseled patient on common side effects. Encouraged to contact if worsening symptoms, unusual behavior changes or suicidal thoughts occur.   Orders: -     Sertraline  HCl; Take 1 tablet (25 mg total) by mouth at bedtime.  Prediabetes Assessment & Plan: She has prediabetes with concern about developing diabetes. Insulin  resistance and prevention strategies were explained. Recheck insulin  levels and discuss potential medication options. Emphasize diet and exercise.  Orders: -     Insulin , Free (Bioactive)  Vitamin D  deficiency Assessment & Plan: She is taking vitamin D  daily instead of weekly. Reassured about safety and advised correct dosing. Recheck vitamin D  levels and advise taking vitamin D  once a week.  Orders: -     VITAMIN D  25 Hydroxy (Vit-D Deficiency, Fractures)  Iron  deficiency Assessment & Plan: Inconsistent iron  supplement use has shown improvement in hair loss. Stress may impact adherence. Recheck iron  levels and encourage consistent use of iron  supplements.  Orders: -     IBC + Ferritin -     CBC with  Differential/Platelet       Return in about 6 weeks (around 09/19/2023) for Follow up.   Leron Glance, NP-C [facility] - [facility]

## 2023-08-09 ENCOUNTER — Encounter: Payer: Self-pay | Admitting: Nurse Practitioner

## 2023-08-09 LAB — VITAMIN D 25 HYDROXY (VIT D DEFICIENCY, FRACTURES): VITD: 24.1 ng/mL — ABNORMAL LOW (ref 30.00–100.00)

## 2023-08-09 LAB — CBC WITH DIFFERENTIAL/PLATELET
Basophils Absolute: 0.1 K/uL (ref 0.0–0.1)
Basophils Relative: 1.6 % (ref 0.0–3.0)
Eosinophils Absolute: 0.1 K/uL (ref 0.0–0.7)
Eosinophils Relative: 2.9 % (ref 0.0–5.0)
HCT: 40.7 % (ref 36.0–46.0)
Hemoglobin: 13 g/dL (ref 12.0–15.0)
Lymphocytes Relative: 32.5 % (ref 12.0–46.0)
Lymphs Abs: 1.5 K/uL (ref 0.7–4.0)
MCHC: 32 g/dL (ref 30.0–36.0)
MCV: 78 fl (ref 78.0–100.0)
Monocytes Absolute: 0.2 K/uL (ref 0.1–1.0)
Monocytes Relative: 5.3 % (ref 3.0–12.0)
Neutro Abs: 2.7 K/uL (ref 1.4–7.7)
Neutrophils Relative %: 57.7 % (ref 43.0–77.0)
Platelets: 225 K/uL (ref 150.0–400.0)
RBC: 5.22 Mil/uL — ABNORMAL HIGH (ref 3.87–5.11)
RDW: 14.5 % (ref 11.5–15.5)
WBC: 4.6 K/uL (ref 4.0–10.5)

## 2023-08-09 LAB — IBC + FERRITIN
Ferritin: 69.4 ng/mL (ref 10.0–291.0)
Iron: 62 ug/dL (ref 42–145)
Saturation Ratios: 17.2 % — ABNORMAL LOW (ref 20.0–50.0)
TIBC: 361.2 ug/dL (ref 250.0–450.0)
Transferrin: 258 mg/dL (ref 212.0–360.0)

## 2023-08-10 ENCOUNTER — Other Ambulatory Visit: Payer: Self-pay | Admitting: Nurse Practitioner

## 2023-08-10 ENCOUNTER — Other Ambulatory Visit (HOSPITAL_COMMUNITY): Payer: Self-pay

## 2023-08-10 MED ORDER — NALTREXONE-BUPROPION HCL ER 8-90 MG PO TB12
ORAL_TABLET | ORAL | 3 refills | Status: DC
Start: 2023-08-10 — End: 2023-09-20

## 2023-08-11 ENCOUNTER — Other Ambulatory Visit (HOSPITAL_COMMUNITY): Payer: Self-pay

## 2023-08-11 ENCOUNTER — Telehealth: Payer: Self-pay

## 2023-08-11 LAB — INSULIN, FREE (BIOACTIVE): Insulin, Free: 16.6 u[IU]/mL — ABNORMAL HIGH (ref 1.5–14.9)

## 2023-08-11 NOTE — Telephone Encounter (Signed)
 Pharmacy Patient Advocate Encounter   Received notification from Pt Calls Messages that prior authorization for Contrave  8-90MG  er tablets is required/requested.   Insurance verification completed.   The patient is insured through [facility] .   Per test claim: PA required; PA submitted to above mentioned insurance via CoverMyMeds Key/confirmation #/EOC [id number] Status is pending

## 2023-08-11 NOTE — Telephone Encounter (Addendum)
 Wegovy  discontinued due to plan exclusion and changed to Contrave .    PA request for Contrave  has been Submitted. New Encounter has been or will be created for follow up. For additional info see Pharmacy Prior Auth telephone encounter from 08/11/23.

## 2023-08-14 ENCOUNTER — Encounter: Payer: Self-pay | Admitting: Nurse Practitioner

## 2023-08-14 ENCOUNTER — Ambulatory Visit: Payer: Self-pay | Admitting: Nurse Practitioner

## 2023-08-14 DIAGNOSIS — E611 Iron deficiency: Secondary | ICD-10-CM | POA: Insufficient documentation

## 2023-08-14 NOTE — Telephone Encounter (Signed)
 Pharmacy Patient Advocate Encounter  Received notification from [employer] that Prior Authorization for Contrave  8-90MG  er tablets  has been DENIED.  Full denial letter will be uploaded to the media tab. See denial reason below.   PA #/Case ID/Reference #: [id number]

## 2023-08-14 NOTE — Assessment & Plan Note (Signed)
 She is taking vitamin D  daily instead of weekly. Reassured about safety and advised correct dosing. Recheck vitamin D  levels and advise taking vitamin D  once a week.

## 2023-08-14 NOTE — Assessment & Plan Note (Signed)
 Inconsistent iron  supplement use has shown improvement in hair loss. Stress may impact adherence. Recheck iron  levels and encourage consistent use of iron  supplements.

## 2023-08-14 NOTE — Assessment & Plan Note (Signed)
 Persistent ankle swelling is possibly due to high salt intake and weight gain. An echocardiogram is planned to rule out cardiac causes. Order echocardiogram to assess cardiac function and advise reducing salt intake. Encourage weight loss.

## 2023-08-14 NOTE — Assessment & Plan Note (Signed)
 She has prediabetes with concern about developing diabetes. Insulin  resistance and prevention strategies were explained. Recheck insulin  levels and discuss potential medication options. Emphasize diet and exercise.

## 2023-08-14 NOTE — Assessment & Plan Note (Signed)
 Sertraline  taken PRN is ineffective. Explained the need for consistent daily use and addressed stigma concerns. Advise taking sertraline  25 mg daily and reassure about safety and purpose. Plan to reassess mood and medication effectiveness in future visit. Counseled patient on common side effects. Encouraged to contact if worsening symptoms, unusual behavior changes or suicidal thoughts occur.

## 2023-08-14 NOTE — Assessment & Plan Note (Signed)
 Weight gain is attributed to stress and poor diet, with previous weight loss medications hindered by insurance. She is motivated to lose weight with her brother's support. Check insurance denial for Zepbound  and explore cost options. Trial Wegovy  if insurance covers it. Discuss dietary modifications to reduce calorie intake and improve portion control. Consider Contrave  if insurance does not cover Wegovy .

## 2023-09-20 ENCOUNTER — Ambulatory Visit (INDEPENDENT_AMBULATORY_CARE_PROVIDER_SITE_OTHER): Admitting: Nurse Practitioner

## 2023-09-20 VITALS — BP 118/72 | HR 64 | Temp 97.5°F | Ht 66.0 in | Wt 314.0 lb

## 2023-09-20 DIAGNOSIS — R7303 Prediabetes: Secondary | ICD-10-CM

## 2023-09-20 DIAGNOSIS — F419 Anxiety disorder, unspecified: Secondary | ICD-10-CM

## 2023-09-20 DIAGNOSIS — Z6841 Body Mass Index (BMI) 40.0 and over, adult: Secondary | ICD-10-CM

## 2023-09-20 DIAGNOSIS — F32A Depression, unspecified: Secondary | ICD-10-CM

## 2023-09-20 DIAGNOSIS — R600 Localized edema: Secondary | ICD-10-CM

## 2023-09-20 NOTE — Progress Notes (Signed)
 Leron Glance, NP-C Phone: [phone]  Rita [name] is a 43 y.o. female who presents today for follow up.   Discussed the use of AI scribe software for clinical note transcription with the patient, who gave verbal consent to proceed.  History of Present Illness   Rita [name] is a 43 year old female with diabetes who presents for a follow-up regarding weight loss management.  She has been using her partner's semaglutide  medication, administering 0.5 mg once a week. Initially, she experienced nausea with the first dose but has since adjusted without further side effects. She has lost 11 pounds since her last visit. No current symptoms of nausea, constipation, diarrhea, or reflux, except for a single episode of nausea with the first dose.  Her diet has changed significantly; she reports not eating unhealthily and sometimes not feeling hungry at all. However, she admits to eating fast food due to her busy lifestyle with her children. She frequently consumes chicken wings, which are not breaded, and acknowledges the need to make healthier dietary choices. She has not been exercising regularly.  She experiences swelling in her legs, particularly in the left leg, and reports consuming a high amount of salt. The swelling decreases when she elevates her legs at night. She has not been using any medication for this issue recently.  She discusses her mental health, noting that she has not been taking her prescribed medication consistently. She describes feeling 'on edge' and having difficulty relaxing, often staying up late to ensure everything is in order at home. She acknowledges the need to set boundaries and prioritize self-care.      Social History   Tobacco Use  Smoking Status Never   Passive exposure: Never  Smokeless Tobacco Never    Current Outpatient Medications on File Prior to Visit  Medication Sig Dispense Refill   furosemide  (LASIX ) 20 MG tablet Take 1 tablet (20 mg total) by  mouth daily as needed for edema. 30 tablet 2   Iron , Ferrous Sulfate , 325 (65 Fe) MG TABS Take 1 tablet by mouth daily. 90 tablet 0   Vitamin D , Ergocalciferol , (DRISDOL ) 1.25 MG (50000 UNIT) CAPS capsule Take 1 capsule (50,000 Units total) by mouth every 7 (seven) days. 13 capsule 1   No current facility-administered medications on file prior to visit.     ROS see history of present illness  Objective  Physical Exam Vitals:   09/20/23 0942  BP: 118/72  Pulse: 64  Temp: (!) 97.5 F (36.4 C)  SpO2: 99%    BP Readings from Last 3 Encounters:  09/20/23 118/72  08/08/23 126/72  06/27/23 122/84   Wt Readings from Last 3 Encounters:  09/20/23 (!) 314 lb (142.4 kg)  08/08/23 (!) 325 lb 3.2 oz (147.5 kg)  06/27/23 (!) 319 lb 9.6 oz (145 kg)    Physical Exam Constitutional:      General: She is not in acute distress.    Appearance: Normal appearance. She is obese.  HENT:     Head: Normocephalic.  Cardiovascular:     Rate and Rhythm: Normal rate and regular rhythm.     Heart sounds: Normal heart sounds.  Pulmonary:     Effort: Pulmonary effort is normal.     Breath sounds: Normal breath sounds.  Musculoskeletal:     Right lower leg: 1+ Edema present.     Left lower leg: 1+ Edema present.  Skin:    General: Skin is warm and dry.  Neurological:  General: No focal deficit present.     Mental Status: She is alert.  Psychiatric:        Mood and Affect: Mood normal.        Behavior: Behavior normal.      Assessment/Plan: Please see individual problem list.  Morbid obesity with BMI of 50.0-59.9, adult (HCC) Assessment & Plan: She lost 11 pounds with semaglutide  0.5 mg weekly, and initial nausea has resolved. She is not engaging in regular physical activity, and her diet is high in fast food and fried items. It is important to combine medication with lifestyle changes. Discussed the risks of dehydration and muscle loss with weight loss medications and advised a  high-protein intake. Continue semaglutide  0.5 mg once a week. Encourage regular physical activity, such as walking, and advise on a high-protein, low-carb diet. Emphasize reducing fast food and fried food intake. Encourage adequate hydration. We will continue to monitor.    Anxiety and depression Assessment & Plan: She has a history of anxiety and depression and is not on medication due to concerns about its impact on her ability to function and care for her children. She has tried Zoloft  in the past but was not consistent with taking the medication. Discussed non-medication strategies, including setting boundaries and self-care. Encourage therapy and mental health resources. Suggest reading materials and podcasts for anxiety management. We will continue to monitor. Advised to contact if symptoms are worsening, changing or she would like to start on medication.    Prediabetes Assessment & Plan: Her prediabetes is currently controlled. Maintaining a healthy diet is crucial for managing blood sugar, focusing on low-carb and high-protein intake. Advise caution with high-sugar foods and sauces. Encourage healthy diet, regular exercise and lifestyle modifications. Continue semaglutide  0.5 mg weekly. We will continue to monitor.    Peripheral edema Assessment & Plan: She experiences edema, worsened by high salt intake, but it improves with weight loss and leg elevation. Although better, edema is still present. Advise reducing salt intake and encourage leg elevation to reduce swelling. Discuss the option of using compression socks.       Return in about 3 months (around 12/21/2023) for Follow up.   Leron Glance, NP-C [facility] - [facility]

## 2023-10-03 DIAGNOSIS — F32A Depression, unspecified: Secondary | ICD-10-CM | POA: Insufficient documentation

## 2023-10-03 NOTE — Assessment & Plan Note (Addendum)
 She has a history of anxiety and depression and is not on medication due to concerns about its impact on her ability to function and care for her children. She has tried Zoloft  in the past but was not consistent with taking the medication. Discussed non-medication strategies, including setting boundaries and self-care. Encourage therapy and mental health resources. Suggest reading materials and podcasts for anxiety management. We will continue to monitor. Advised to contact if symptoms are worsening, changing or she would like to start on medication.

## 2023-10-03 NOTE — Assessment & Plan Note (Signed)
 She experiences edema, worsened by high salt intake, but it improves with weight loss and leg elevation. Although better, edema is still present. Advise reducing salt intake and encourage leg elevation to reduce swelling. Discuss the option of using compression socks.

## 2023-10-03 NOTE — Assessment & Plan Note (Signed)
 She lost 11 pounds with semaglutide  0.5 mg weekly, and initial nausea has resolved. She is not engaging in regular physical activity, and her diet is high in fast food and fried items. It is important to combine medication with lifestyle changes. Discussed the risks of dehydration and muscle loss with weight loss medications and advised a high-protein intake. Continue semaglutide  0.5 mg once a week. Encourage regular physical activity, such as walking, and advise on a high-protein, low-carb diet. Emphasize reducing fast food and fried food intake. Encourage adequate hydration. We will continue to monitor.

## 2023-10-03 NOTE — Assessment & Plan Note (Signed)
 Her prediabetes is currently controlled. Maintaining a healthy diet is crucial for managing blood sugar, focusing on low-carb and high-protein intake. Advise caution with high-sugar foods and sauces. Encourage healthy diet, regular exercise and lifestyle modifications. Continue semaglutide  0.5 mg weekly. We will continue to monitor.

## 2023-10-20 ENCOUNTER — Ambulatory Visit: Payer: 59 | Admitting: Plastic Surgery

## 2023-12-21 ENCOUNTER — Ambulatory Visit
Admission: RE | Admit: 2023-12-21 | Discharge: 2023-12-21 | Disposition: A | Discharge status: Home / Self Care | Source: Ambulatory Visit | Attending: Nurse Practitioner | Admitting: Nurse Practitioner

## 2023-12-21 DIAGNOSIS — R0600 Dyspnea, unspecified: Secondary | ICD-10-CM | POA: Insufficient documentation

## 2023-12-21 DIAGNOSIS — I351 Nonrheumatic aortic (valve) insufficiency: Secondary | ICD-10-CM | POA: Diagnosis not present

## 2023-12-21 DIAGNOSIS — R600 Localized edema: Secondary | ICD-10-CM | POA: Diagnosis present

## 2023-12-21 DIAGNOSIS — I517 Cardiomegaly: Secondary | ICD-10-CM | POA: Diagnosis not present

## 2023-12-21 LAB — ECHOCARDIOGRAM COMPLETE
AR max vel: 2.58 cm2
AV Area VTI: 2.42 cm2
AV Area mean vel: 2.45 cm2
AV Mean grad: 4 mmHg
AV Peak grad: 7.5 mmHg
Ao pk vel: 1.37 m/s
Area-P 1/2: 4.06 cm2
Calc EF: 63.3 %
MV VTI: 3.02 cm2
S' Lateral: 2.5 cm
Single Plane A2C EF: 60.5 %
Single Plane A4C EF: 65.1 %

## 2023-12-22 ENCOUNTER — Other Ambulatory Visit: Payer: Self-pay | Admitting: Nurse Practitioner

## 2023-12-22 DIAGNOSIS — R600 Localized edema: Secondary | ICD-10-CM

## 2024-01-02 ENCOUNTER — Ambulatory Visit: Payer: Self-pay | Admitting: Nurse Practitioner
